# Patient Record
Sex: Male | Born: 1945
Health system: Southern US, Community
[De-identification: ages and names within clinical notes are randomized; demographics above are authoritative.]

## PROBLEM LIST (undated history)

## (undated) DIAGNOSIS — I10 Essential (primary) hypertension: Secondary | ICD-10-CM

## (undated) DIAGNOSIS — I724 Aneurysm of artery of lower extremity: Secondary | ICD-10-CM

## (undated) DIAGNOSIS — E7800 Pure hypercholesterolemia, unspecified: Secondary | ICD-10-CM

## (undated) DIAGNOSIS — D689 Coagulation defect, unspecified: Secondary | ICD-10-CM

## (undated) DIAGNOSIS — S62609A Fracture of unspecified phalanx of unspecified finger, initial encounter for closed fracture: Secondary | ICD-10-CM

## (undated) DIAGNOSIS — M1990 Unspecified osteoarthritis, unspecified site: Secondary | ICD-10-CM

## (undated) DIAGNOSIS — N200 Calculus of kidney: Secondary | ICD-10-CM

## (undated) HISTORY — PX: LITHOTRIPSY: SUR834

## (undated) HISTORY — DX: Coagulation defect, unspecified: D68.9

## (undated) HISTORY — PX: POLYPECTOMY: SHX149

---

## 2003-03-16 HISTORY — PX: ELECTROCARDIOGRAM: SHX264

## 2006-10-13 ENCOUNTER — Ambulatory Visit: Payer: Self-pay | Admitting: Family Medicine

## 2006-10-19 ENCOUNTER — Ambulatory Visit: Payer: Self-pay | Admitting: Family Medicine

## 2006-10-21 ENCOUNTER — Ambulatory Visit: Payer: Self-pay | Admitting: Gastroenterology

## 2006-11-04 ENCOUNTER — Ambulatory Visit: Payer: Self-pay | Admitting: Gastroenterology

## 2006-11-04 ENCOUNTER — Encounter (INDEPENDENT_AMBULATORY_CARE_PROVIDER_SITE_OTHER): Payer: Self-pay | Admitting: *Deleted

## 2006-11-04 HISTORY — PX: COLONOSCOPY: SHX174

## 2008-05-22 ENCOUNTER — Ambulatory Visit: Payer: Self-pay | Admitting: Family Medicine

## 2008-06-25 ENCOUNTER — Ambulatory Visit: Payer: Self-pay | Admitting: Family Medicine

## 2008-07-26 ENCOUNTER — Ambulatory Visit: Payer: Self-pay | Admitting: Family Medicine

## 2008-08-24 ENCOUNTER — Ambulatory Visit: Payer: Self-pay | Admitting: Family Medicine

## 2009-09-13 ENCOUNTER — Ambulatory Visit: Payer: Self-pay | Admitting: Family Medicine

## 2010-10-22 ENCOUNTER — Ambulatory Visit: Payer: Self-pay | Admitting: Family Medicine

## 2011-10-27 ENCOUNTER — Other Ambulatory Visit: Payer: Self-pay | Admitting: Family Medicine

## 2011-11-03 ENCOUNTER — Encounter: Payer: Self-pay | Admitting: Gastroenterology

## 2012-08-15 ENCOUNTER — Encounter: Payer: Self-pay | Admitting: Gastroenterology

## 2012-10-20 ENCOUNTER — Ambulatory Visit (INDEPENDENT_AMBULATORY_CARE_PROVIDER_SITE_OTHER): Payer: 59 | Admitting: Family Medicine

## 2012-10-20 ENCOUNTER — Encounter: Payer: Self-pay | Admitting: Family Medicine

## 2012-10-20 VITALS — BP 120/80 | HR 68 | Temp 98.2°F | Resp 16 | Ht 70.0 in | Wt 171.0 lb

## 2012-10-20 DIAGNOSIS — Z125 Encounter for screening for malignant neoplasm of prostate: Secondary | ICD-10-CM

## 2012-10-20 DIAGNOSIS — L309 Dermatitis, unspecified: Secondary | ICD-10-CM | POA: Insufficient documentation

## 2012-10-20 DIAGNOSIS — Z86010 Personal history of colonic polyps: Secondary | ICD-10-CM

## 2012-10-20 DIAGNOSIS — I10 Essential (primary) hypertension: Secondary | ICD-10-CM | POA: Insufficient documentation

## 2012-10-20 DIAGNOSIS — Z0000 Encounter for general adult medical examination without abnormal findings: Secondary | ICD-10-CM

## 2012-10-20 DIAGNOSIS — L259 Unspecified contact dermatitis, unspecified cause: Secondary | ICD-10-CM

## 2012-10-20 DIAGNOSIS — Z79899 Other long term (current) drug therapy: Secondary | ICD-10-CM

## 2012-10-20 LAB — LIPID PANEL
Cholesterol: 249 mg/dL — ABNORMAL HIGH (ref 0–200)
Total CHOL/HDL Ratio: 4.4 Ratio
Triglycerides: 112 mg/dL (ref <150)
VLDL: 22 mg/dL (ref 0–40)

## 2012-10-20 LAB — CBC WITH DIFFERENTIAL/PLATELET
Basophils Relative: 0 % (ref 0–1)
Eosinophils Absolute: 0.1 10*3/uL (ref 0.0–0.7)
Eosinophils Relative: 2 % (ref 0–5)
Hemoglobin: 14.2 g/dL (ref 13.0–17.0)
Lymphs Abs: 1.3 10*3/uL (ref 0.7–4.0)
MCH: 30.5 pg (ref 26.0–34.0)
MCHC: 35.1 g/dL (ref 30.0–36.0)
MCV: 86.9 fL (ref 78.0–100.0)
Monocytes Absolute: 0.3 10*3/uL (ref 0.1–1.0)
Monocytes Relative: 6 % (ref 3–12)
Neutrophils Relative %: 68 % (ref 43–77)
RBC: 4.66 MIL/uL (ref 4.22–5.81)

## 2012-10-20 LAB — POCT URINALYSIS DIPSTICK
Glucose, UA: NEGATIVE
Ketones, UA: NEGATIVE
Leukocytes, UA: NEGATIVE
Spec Grav, UA: <=1.005
Urobilinogen, UA: NEGATIVE

## 2012-10-20 LAB — PSA: PSA: 1.35 ng/mL (ref <=4.00)

## 2012-10-20 MED ORDER — AMLODIPINE BESYLATE 5 MG PO TABS: 5.0000 mg | ORAL_TABLET | Freq: Every day | ORAL | Status: DC

## 2012-10-20 MED ORDER — DESOXIMETASONE 0.25 % EX CREA: TOPICAL_CREAM | Freq: Two times a day (BID) | CUTANEOUS | Status: DC

## 2012-10-20 NOTE — Progress Notes (Signed)
Subjective:    Patient ID: Mike Wong, male    DOB: [birth year], 66 y.o.   MRN: [id number]  HPI He is here for complete examination. He would like to be switched to a different blood pressure medicine to help cut down on cost. Review his record indicates he had difficulty with lisinopril causing headaches and he would like to try something else. He also has a rash present on his right hand and in the past has used a steroid cream. He did see a dermatologist for this and would like a refill. He does have a previous history of colonoscopy showing tubular adenoma and he will need followup on this. Otherwise he has no concerns or questions. His family and social history were reviewed. He does plan on Medicare next year.   Review of Systems  Constitutional: Negative.   HENT: Negative.   Eyes: Negative.   Respiratory: Negative.   Cardiovascular: Negative.   Gastrointestinal: Negative.   Genitourinary: Negative.   Musculoskeletal: Negative.   Skin: Negative.   Neurological: Negative.   Hematological: Negative.   Psychiatric/Behavioral: Negative.        Objective:   Physical ExamBP 120/80  Pulse 68  Temp 98.2 F (36.8 C) (Oral)  Resp 16  Ht 5\' 10"  (1.778 m)  Wt 171 lb (77.565 kg)  BMI 24.54 kg/m2  General Appearance:    Alert, cooperative, no distress, appears stated age  Head:    Normocephalic, without obvious abnormality, atraumatic  Eyes:    PERRL, conjunctiva/corneas clear, EOM's intact, fundi    benign  Ears:    Normal TM's and external ear canals  Nose:   Nares normal, mucosa normal, no drainage or sinus   tenderness  Throat:   Lips, mucosa, and tongue normal; teeth and gums normal  Neck:   Supple, no lymphadenopathy;  thyroid:  no   enlargement/tenderness/nodules; no carotid   bruit or JVD  Back:    Spine nontender, no curvature, ROM normal, no CVA     tenderness  Lungs:     Clear to auscultation bilaterally without wheezes, rales or     ronchi; respirations  unlabored  Chest Wall:    No tenderness or deformity   Heart:    Regular rate and rhythm, S1 and S2 normal, no murmur, rub   or gallop  Breast Exam:    No chest wall tenderness, masses or gynecomastia  Abdomen:     Soft, non-tender, nondistended, normoactive bowel sounds,    no masses, no hepatosplenomegaly  Genitalia:   deferred   Rectal:   deferred   Extremities:   No clubbing, cyanosis or edema  Pulses:   2+ and symmetric all extremities  Skin:   Skin color, texture, turgor normal, no rashes or lesions  Lymph nodes:   Cervical, supraclavicular, and axillary nodes normal  Neurologic:   CNII-XII intact, normal strength, sensation and gait; reflexes 2+ and symmetric throughout          Psych:   Normal mood, affect, hygiene and grooming.   EKG is normal       Assessment & Plan:   1. Routine general medical examination at a health care facility  CBC with Differential, TSH, Lipid Panel, Urinalysis Dipstick, Visual acuity screening, Comprehensive metabolic panel, PR ELECTROCARDIOGRAM, COMPLETE, Lipid panel, CBC with Differential, Comprehensive metabolic panel, desoximetasone (TOPICORT) 0.25 % cream  2. Hypertension  amLODipine (NORVASC) 5 MG tablet  3. Dermatitis  desoximetasone (TOPICORT) 0.25 % cream  4. Encounter for long-term (current) use  of other medications    5. Special screening for malignant neoplasm of prostate  PSA  6. History of colonic polyps     flu shot was declined to

## 2012-10-21 ENCOUNTER — Encounter: Payer: Self-pay | Admitting: Internal Medicine

## 2012-10-24 LAB — COMPREHENSIVE METABOLIC PANEL
BUN: 20 mg/dL (ref 6–23)
CO2: 28 mEq/L (ref 19–32)
Calcium: 9.8 mg/dL (ref 8.4–10.5)
Chloride: 102 mEq/L (ref 96–112)
Creat: 0.88 mg/dL (ref 0.50–1.35)
Total Bilirubin: 0.6 mg/dL (ref 0.3–1.2)

## 2012-12-20 ENCOUNTER — Encounter: Payer: Self-pay | Admitting: Family Medicine

## 2012-12-20 ENCOUNTER — Ambulatory Visit (INDEPENDENT_AMBULATORY_CARE_PROVIDER_SITE_OTHER): Payer: BC Managed Care – PPO | Admitting: Family Medicine

## 2012-12-20 VITALS — BP 140/80 | HR 62 | Temp 98.4°F | Wt 172.0 lb

## 2012-12-20 DIAGNOSIS — I10 Essential (primary) hypertension: Secondary | ICD-10-CM

## 2012-12-20 MED ORDER — AMLODIPINE BESYLATE 5 MG PO TABS: 5.0000 mg | ORAL_TABLET | Freq: Every day | ORAL | Status: DC

## 2012-12-20 NOTE — Progress Notes (Signed)
  Subjective:    Patient ID: Mike Wong, male    DOB: 1946-02-03, 67 y.o.   MRN: [id number]  HPI  he is here for recheck. His insurance would no longer cover Benicar. He was switched amlodipine and is here for recheck. He is having no difficulty with that medication.   Review of Systems     Objective:   Physical Exam Alert and in no distress. Blood pressure is recorded.       Assessment & Plan:   1. Hypertension  amLODipine (NORVASC) 5 MG tablet   he is to continue on this medication.

## 2013-04-12 DIAGNOSIS — B351 Tinea unguium: Secondary | ICD-10-CM | POA: Diagnosis not present

## 2013-04-12 DIAGNOSIS — L408 Other psoriasis: Secondary | ICD-10-CM | POA: Diagnosis not present

## 2013-04-26 DIAGNOSIS — B352 Tinea manuum: Secondary | ICD-10-CM | POA: Diagnosis not present

## 2013-04-26 DIAGNOSIS — B351 Tinea unguium: Secondary | ICD-10-CM | POA: Diagnosis not present

## 2013-07-18 ENCOUNTER — Ambulatory Visit (INDEPENDENT_AMBULATORY_CARE_PROVIDER_SITE_OTHER): Payer: Medicare Other | Admitting: Family Medicine

## 2013-07-18 VITALS — BP 128/88 | HR 88 | Temp 99.9°F | Wt 169.0 lb

## 2013-07-18 DIAGNOSIS — J069 Acute upper respiratory infection, unspecified: Secondary | ICD-10-CM

## 2013-07-18 NOTE — Patient Instructions (Signed)
Any cough suppressant can be used but use NyQuil at nightUpper Respiratory Infection, Adult An upper respiratory infection (URI) is also known as the common cold. It is often caused by a type of germ (virus). Colds are easily spread (contagious). You can pass it to others by kissing, coughing, sneezing, or drinking out of the same glass. Usually, you get better in 1 or 2 weeks.  HOME CARE   Only take medicine as told by your doctor.  Use a warm mist humidifier or breathe in steam from a hot shower.  Drink enough water and fluids to keep your pee (urine) clear or pale yellow.  Get plenty of rest.  Return to work when your temperature is back to normal or as told by your doctor. You may use a face mask and wash your hands to stop your cold from spreading. GET HELP RIGHT AWAY IF:   After the first few days, you feel you are getting worse.  You have questions about your medicine.  You have chills, shortness of breath, or brown or red spit (mucus).  You have yellow or brown snot (nasal discharge) or pain in the face, especially when you bend forward.  You have a fever, puffy (swollen) neck, pain when you swallow, or white spots in the back of your throat.  You have a bad headache, ear pain, sinus pain, or chest pain.  You have a high-pitched whistling sound when you breathe in and out (wheezing).  You have a lasting cough or cough up blood.  You have sore muscles or a stiff neck. MAKE SURE YOU:   Understand these instructions.  Will watch your condition.  Will get help right away if you are not doing well or get worse. Document Released: 04/20/2008 Document Revised: 01/25/2012 Document Reviewed: 03/09/2011 [facility] Patient Information 2014 [place], [place].

## 2013-07-18 NOTE — Progress Notes (Signed)
  Subjective:    Patient ID: Mike Wong, male    DOB: 08/14/1946, 67 y.o.   MRN: [id number]  HPI 5 days ago he noted the onset of a slight sore throat slowly got worse. 2 days ago he started having difficulty with cough and a headache with rhinorrhea.   Review of Systems     Objective:   Physical Exam alert and in no distress. Tympanic membranes and canals are normal. Throat is clear. Tonsils are normal. Neck is supple without adenopathy or thyromegaly. Cardiac exam shows a regular sinus rhythm without murmurs or gallops. Lungs are clear to auscultation.        Assessment & Plan:  URI, acute  recommend supportive care including use of cough medications and NyQuil at night.

## 2014-01-21 ENCOUNTER — Other Ambulatory Visit: Payer: Self-pay | Admitting: Family Medicine

## 2014-01-22 NOTE — Telephone Encounter (Signed)
MEdication sent in

## 2014-01-22 NOTE — Telephone Encounter (Signed)
Forwarding to you :)

## 2014-03-13 ENCOUNTER — Encounter: Payer: Self-pay | Admitting: Family Medicine

## 2014-03-13 ENCOUNTER — Other Ambulatory Visit: Payer: Self-pay | Admitting: Family Medicine

## 2014-03-13 ENCOUNTER — Ambulatory Visit (INDEPENDENT_AMBULATORY_CARE_PROVIDER_SITE_OTHER): Payer: Medicare Other | Admitting: Family Medicine

## 2014-03-13 VITALS — BP 130/84 | HR 60 | Ht 70.0 in | Wt 172.0 lb

## 2014-03-13 DIAGNOSIS — Z86010 Personal history of colonic polyps: Secondary | ICD-10-CM

## 2014-03-13 DIAGNOSIS — N400 Enlarged prostate without lower urinary tract symptoms: Secondary | ICD-10-CM

## 2014-03-13 DIAGNOSIS — I10 Essential (primary) hypertension: Secondary | ICD-10-CM

## 2014-03-13 DIAGNOSIS — E785 Hyperlipidemia, unspecified: Secondary | ICD-10-CM | POA: Diagnosis not present

## 2014-03-13 DIAGNOSIS — Z8639 Personal history of other endocrine, nutritional and metabolic disease: Secondary | ICD-10-CM | POA: Diagnosis not present

## 2014-03-13 DIAGNOSIS — Z87442 Personal history of urinary calculi: Secondary | ICD-10-CM

## 2014-03-13 DIAGNOSIS — N401 Enlarged prostate with lower urinary tract symptoms: Secondary | ICD-10-CM | POA: Insufficient documentation

## 2014-03-13 LAB — COMPREHENSIVE METABOLIC PANEL
ALBUMIN: 4.4 g/dL (ref 3.5–5.2)
ALK PHOS: 93 U/L (ref 39–117)
ALT: 19 U/L (ref 0–53)
AST: 19 U/L (ref 0–37)
BUN: 13 mg/dL (ref 6–23)
CO2: 30 mEq/L (ref 19–32)
Calcium: 9.8 mg/dL (ref 8.4–10.5)
Chloride: 101 mEq/L (ref 96–112)
Creat: 0.91 mg/dL (ref 0.50–1.35)
Glucose, Bld: 88 mg/dL (ref 70–99)
POTASSIUM: 4 meq/L (ref 3.5–5.3)
SODIUM: 139 meq/L (ref 135–145)
TOTAL PROTEIN: 7.3 g/dL (ref 6.0–8.3)
Total Bilirubin: 0.6 mg/dL (ref 0.2–1.2)

## 2014-03-13 LAB — CBC WITH DIFFERENTIAL/PLATELET
BASOS PCT: 0 % (ref 0–1)
Basophils Absolute: 0 10*3/uL (ref 0.0–0.1)
EOS ABS: 0.1 10*3/uL (ref 0.0–0.7)
Eosinophils Relative: 2 % (ref 0–5)
HCT: 42.8 % (ref 39.0–52.0)
HEMOGLOBIN: 14.9 g/dL (ref 13.0–17.0)
Lymphocytes Relative: 23 % (ref 12–46)
Lymphs Abs: 1.4 10*3/uL (ref 0.7–4.0)
MCH: 31 pg (ref 26.0–34.0)
MCHC: 34.8 g/dL (ref 30.0–36.0)
MCV: 89 fL (ref 78.0–100.0)
MONOS PCT: 5 % (ref 3–12)
Monocytes Absolute: 0.3 10*3/uL (ref 0.1–1.0)
NEUTROS PCT: 70 % (ref 43–77)
Neutro Abs: 4.1 10*3/uL (ref 1.7–7.7)
PLATELETS: 286 10*3/uL (ref 150–400)
RBC: 4.81 MIL/uL (ref 4.22–5.81)
RDW: 13.5 % (ref 11.5–15.5)
WBC: 5.9 10*3/uL (ref 4.0–10.5)

## 2014-03-13 LAB — POCT URINALYSIS DIPSTICK
Bilirubin, UA: NEGATIVE
Blood, UA: NEGATIVE
GLUCOSE UA: NEGATIVE
Ketones, UA: NEGATIVE
LEUKOCYTES UA: NEGATIVE
NITRITE UA: NEGATIVE
Protein, UA: NEGATIVE
Spec Grav, UA: 1.01
UROBILINOGEN UA: NEGATIVE
pH, UA: 7

## 2014-03-13 LAB — LIPID PANEL
CHOLESTEROL: 277 mg/dL — AB (ref 0–200)
HDL: 63 mg/dL (ref >39)
LDL CALC: 172 mg/dL — AB (ref 0–99)
Total CHOL/HDL Ratio: 4.4 Ratio
Triglycerides: 210 mg/dL — ABNORMAL HIGH (ref <150)
VLDL: 42 mg/dL — AB (ref 0–40)

## 2014-03-13 MED ORDER — AMLODIPINE BESYLATE 5 MG PO TABS: ORAL_TABLET | ORAL | Status: DC

## 2014-03-13 NOTE — Progress Notes (Signed)
   Subjective:    Patient ID: Mike Wong, male    DOB: [birth year], 68 y.o.   MRN: [id number]  HPI He is here for an interval evaluation. He continues on his blood pressure medications and is having no difficulty. He does have a previous history of colonic polyps. Review his record indicates he needs a referral for followup on this. He also has a previous history of hyperlipidemia as well as vitamin D deficiency and BPH. He has had no BPH symptoms. His remote history of renal stone. His had no difficulty with stone in the last several years. He does not smoke or drink. His work is going well. His home life is unchanged. Rest the family and social history was reviewed.   Review of Systems  All other systems reviewed and are negative.      Objective:   Physical Exam BP 130/84  Pulse 60  Ht 5\' 10"  (1.778 m)  Wt 172 lb (78.019 kg)  BMI 24.68 kg/m2  General Appearance:    Alert, cooperative, no distress, appears stated age  Head:    Normocephalic, without obvious abnormality, atraumatic  Eyes:    PERRL, conjunctiva/corneas clear, EOM's intact, fundi    benign  Ears:    Normal TM's and external ear canals  Nose:   Nares normal, mucosa normal, no drainage or sinus   tenderness  Throat:   Lips, mucosa, and tongue normal; teeth and gums normal  Neck:   Supple, no lymphadenopathy;  thyroid:  no   enlargement/tenderness/nodules; no carotid   bruit or JVD  Back:    Spine nontender, no curvature, ROM normal, no CVA     tenderness  Lungs:     Clear to auscultation bilaterally without wheezes, rales or     ronchi; respirations unlabored  Chest Wall:    No tenderness or deformity   Heart:    Regular rate and rhythm, S1 and S2 normal, no murmur, rub   or gallop  Breast Exam:    No chest wall tenderness, masses or gynecomastia  Abdomen:     Soft, non-tender, nondistended, normoactive bowel sounds,    no masses, no hepatosplenomegaly  Genitalia:   deferred   Rectal:   deferred    Extremities:   No clubbing, cyanosis or edema  Pulses:   2+ and symmetric all extremities  Skin:   Skin color, texture, turgor normal, no rashes or lesions  Lymph nodes:   Cervical, supraclavicular, and axillary nodes normal  Neurologic:   CNII-XII intact, normal strength, sensation and gait; reflexes 2+ and symmetric throughout          Psych:   Normal mood, affect, hygiene and grooming.          Assessment & Plan:  Hypertension - Plan: POCT urinalysis dipstick, Comprehensive metabolic panel, CBC with Differential, amLODipine (NORVASC) 5 MG tablet  Personal history of colonic polyps - Plan: Ambulatory referral to Gastroenterology  Hyperlipidemia LDL goal < 130 - Plan: Lipid panel  History of vitamin D deficiency  BPH (benign prostatic hyperplasia)  History of renal stone  advanced directive was discussed with him. He does have one in place.

## 2014-03-14 LAB — VITAMIN D 25 HYDROXY (VIT D DEFICIENCY, FRACTURES): VIT D 25 HYDROXY: 36 ng/mL (ref 30–89)

## 2014-03-15 MED ORDER — ATORVASTATIN CALCIUM 10 MG PO TABS: 10.0000 mg | ORAL_TABLET | Freq: Every day | ORAL | Status: DC

## 2014-03-15 NOTE — Addendum Note (Signed)
Addended by: Pernell Dupre A on: 03/15/2014 12:07 PM   Modules accepted: Orders

## 2014-04-16 ENCOUNTER — Encounter: Payer: Self-pay | Admitting: Family Medicine

## 2014-05-14 ENCOUNTER — Ambulatory Visit (INDEPENDENT_AMBULATORY_CARE_PROVIDER_SITE_OTHER): Payer: Medicare Other | Admitting: Family Medicine

## 2014-05-14 ENCOUNTER — Other Ambulatory Visit: Payer: Self-pay

## 2014-05-14 ENCOUNTER — Encounter: Payer: Self-pay | Admitting: Family Medicine

## 2014-05-14 VITALS — BP 150/100 | HR 100 | Wt 175.0 lb

## 2014-05-14 DIAGNOSIS — Z79899 Other long term (current) drug therapy: Secondary | ICD-10-CM

## 2014-05-14 DIAGNOSIS — E785 Hyperlipidemia, unspecified: Secondary | ICD-10-CM | POA: Diagnosis not present

## 2014-05-14 DIAGNOSIS — I10 Essential (primary) hypertension: Secondary | ICD-10-CM

## 2014-05-14 MED ORDER — ATORVASTATIN CALCIUM 10 MG PO TABS: 10.0000 mg | ORAL_TABLET | Freq: Every day | ORAL | Status: DC

## 2014-05-14 NOTE — Patient Instructions (Signed)
Keep track of your blood pressure and if it starts to go above 140/90, let me know

## 2014-05-14 NOTE — Telephone Encounter (Signed)
SENT IN PT LIPITOR PER HIS REQUEST

## 2014-05-14 NOTE — Progress Notes (Signed)
   Subjective:    Patient ID: Mike Wong, male    DOB: [birth year], 68 y.o.   MRN: [id number]  HPI He is here for recheck. He continues on medications listed in the chart and is having no difficulty with them. He does state that he checks his blood pressure at home and the numbers always look quite good. He has no concerns or complaints.   Review of Systems     Objective:   Physical Exam  Alert and in no distress otherwise not examined      Assessment & Plan:  Hyperlipidemia with target LDL less than 130 - Plan: Lipid panel  Encounter for long-term (current) use of other medications - Plan: Lipid panel  Essential hypertension  he will keep track of his blood pressure and it does start to go above 140/90, he is to let me know.

## 2014-05-15 ENCOUNTER — Telehealth: Payer: Self-pay

## 2014-05-15 LAB — LIPID PANEL
CHOLESTEROL: 161 mg/dL (ref 0–200)
HDL: 50 mg/dL (ref >39)
LDL Cholesterol: 70 mg/dL (ref 0–99)
TRIGLYCERIDES: 206 mg/dL — AB (ref <150)
Total CHOL/HDL Ratio: 3.2 Ratio
VLDL: 41 mg/dL — AB (ref 0–40)

## 2014-05-15 MED ORDER — ATORVASTATIN CALCIUM 10 MG PO TABS: 10.0000 mg | ORAL_TABLET | Freq: Every day | ORAL | Status: DC

## 2014-05-15 NOTE — Telephone Encounter (Signed)
Called pt he was informed of his labs looking good and med called in pt verbalized understanding

## 2014-05-15 NOTE — Addendum Note (Signed)
Addended by: [name] on: 05/15/2014 08:06 AM   Modules accepted: Orders

## 2015-03-05 ENCOUNTER — Other Ambulatory Visit: Payer: Self-pay | Admitting: Family Medicine

## 2015-03-05 DIAGNOSIS — H2513 Age-related nuclear cataract, bilateral: Secondary | ICD-10-CM | POA: Diagnosis not present

## 2015-03-05 DIAGNOSIS — H25013 Cortical age-related cataract, bilateral: Secondary | ICD-10-CM | POA: Diagnosis not present

## 2015-03-19 ENCOUNTER — Ambulatory Visit (INDEPENDENT_AMBULATORY_CARE_PROVIDER_SITE_OTHER): Payer: Medicare Other | Admitting: Family Medicine

## 2015-03-19 ENCOUNTER — Encounter: Payer: Self-pay | Admitting: Family Medicine

## 2015-03-19 VITALS — BP 148/98 | HR 68 | Ht 70.0 in | Wt 169.2 lb

## 2015-03-19 DIAGNOSIS — Z136 Encounter for screening for cardiovascular disorders: Secondary | ICD-10-CM

## 2015-03-19 DIAGNOSIS — Z87442 Personal history of urinary calculi: Secondary | ICD-10-CM

## 2015-03-19 DIAGNOSIS — I10 Essential (primary) hypertension: Secondary | ICD-10-CM

## 2015-03-19 DIAGNOSIS — Z8639 Personal history of other endocrine, nutritional and metabolic disease: Secondary | ICD-10-CM

## 2015-03-19 DIAGNOSIS — E785 Hyperlipidemia, unspecified: Secondary | ICD-10-CM

## 2015-03-19 DIAGNOSIS — N400 Enlarged prostate without lower urinary tract symptoms: Secondary | ICD-10-CM | POA: Diagnosis not present

## 2015-03-19 DIAGNOSIS — Z86010 Personal history of colonic polyps: Secondary | ICD-10-CM | POA: Diagnosis not present

## 2015-03-19 DIAGNOSIS — Z0000 Encounter for general adult medical examination without abnormal findings: Secondary | ICD-10-CM

## 2015-03-19 DIAGNOSIS — Z23 Encounter for immunization: Secondary | ICD-10-CM | POA: Diagnosis not present

## 2015-03-19 MED ORDER — AMLODIPINE BESYLATE 10 MG PO TABS: 10.0000 mg | ORAL_TABLET | Freq: Every day | ORAL | Status: DC

## 2015-03-19 NOTE — Progress Notes (Signed)
Subjective:    Patient ID: Mike Wong, male    DOB: Jan 28, 1946, 69 y.o.   MRN: [id number]  HPI He is here for an examination. He continues on amlodipine and Lipitor and is having no difficulties with them. He will have another colonoscopy in approximately one year. Does have a previous history of vitamin D deficiency and does intermittently take a multivitamin. He continues to work and has no intentions to quit. Family and social history as well as immunizations were reviewed. He does have a remote history of smoking. His marriage is going well. He has had no chest pain, shortness breath, GI issues. He had a renal stone approximately 10 years ago. He does have a history of BPH but no symptoms interfering with his ADLs. He does have an advanced directive.  Review of Systems  All other systems reviewed and are negative.      Objective:   Physical Exam BP 148/98 mmHg  Pulse 68  Ht 5\' 10"  (1.778 m)  Wt 169 lb 3.2 oz (76.749 kg)  BMI 24.28 kg/m2  General Appearance:    Alert, cooperative, no distress, appears stated age  Head:    Normocephalic, without obvious abnormality, atraumatic  Eyes:    PERRL, conjunctiva/corneas clear, EOM's intact, fundi    benign  Ears:    Normal TM's and external ear canals  Nose:   Nares normal, mucosa normal, no drainage or sinus   tenderness  Throat:   Lips, mucosa, and tongue normal; teeth and gums normal  Neck:   Supple, no lymphadenopathy;  thyroid:  no   enlargement/tenderness/nodules; no carotid   bruit or JVD  Back:    Spine nontender, no curvature, ROM normal, no CVA     tenderness  Lungs:     Clear to auscultation bilaterally without wheezes, rales or     ronchi; respirations unlabored  Chest Wall:    No tenderness or deformity   Heart:    Regular rate and rhythm, S1 and S2 normal, no murmur, rub   or gallop  Breast Exam:    No chest wall tenderness, masses or gynecomastia  Abdomen:     Soft, non-tender, nondistended, normoactive bowel  sounds,    no masses, no hepatosplenomegaly  Genitalia:    Normal male external genitalia without lesions.  Testicles without masses.  No inguinal hernias.  Rectal:    Normal sphincter tone, no masses or tenderness; guaiac negative stool.  Prostate smooth, no nodules, not enlarged.  Extremities:   No clubbing, cyanosis or edema  Pulses:   2+ and symmetric all extremities  Skin:   Skin color, texture, turgor normal, no rashes or lesions  Lymph nodes:   Cervical, supraclavicular, and axillary nodes normal  Neurologic:   CNII-XII intact, normal strength, sensation and gait; reflexes 2+ and symmetric throughout          Psych:   Normal mood, affect, hygiene and grooming.          Assessment & Plan:  Routine general medical examination at a health care facility  History of colonic polyps  Essential hypertension - Plan: CBC with Differential/Platelet, Comprehensive metabolic panel, amLODipine (NORVASC) 10 MG tablet  Hyperlipidemia with target LDL less than 130 - Plan: Lipid panel  History of renal stone  History of vitamin D deficiency  BPH (benign prostatic hyperplasia)  Screening for AAA (abdominal aortic aneurysm) - Plan: [place] Screening AAA, CANCELED: [place] Aorta Initial Medicare Screen  Need for prophylactic vaccination against Streptococcus pneumoniae (pneumococcus) -  Plan: Pneumococcal conjugate vaccine 13-valent I will increase his Norvasc and have him return here in one month. I encouraged him to continue to take good care of himself.

## 2015-03-20 LAB — LIPID PANEL
CHOLESTEROL: 194 mg/dL (ref 0–200)
HDL: 62 mg/dL (ref >=40)
LDL Cholesterol: 106 mg/dL — ABNORMAL HIGH (ref 0–99)
Total CHOL/HDL Ratio: 3.1 Ratio
Triglycerides: 130 mg/dL (ref <150)
VLDL: 26 mg/dL (ref 0–40)

## 2015-03-20 LAB — CBC WITH DIFFERENTIAL/PLATELET
Basophils Absolute: 0 10*3/uL (ref 0.0–0.1)
Basophils Relative: 0 % (ref 0–1)
EOS ABS: 0.1 10*3/uL (ref 0.0–0.7)
Eosinophils Relative: 2 % (ref 0–5)
HCT: 42.4 % (ref 39.0–52.0)
HEMOGLOBIN: 14.3 g/dL (ref 13.0–17.0)
LYMPHS ABS: 1.2 10*3/uL (ref 0.7–4.0)
Lymphocytes Relative: 19 % (ref 12–46)
MCH: 30.8 pg (ref 26.0–34.0)
MCHC: 33.7 g/dL (ref 30.0–36.0)
MCV: 91.4 fL (ref 78.0–100.0)
MPV: 10.3 fL (ref 8.6–12.4)
Monocytes Absolute: 0.4 10*3/uL (ref 0.1–1.0)
Monocytes Relative: 6 % (ref 3–12)
Neutro Abs: 4.5 10*3/uL (ref 1.7–7.7)
Neutrophils Relative %: 73 % (ref 43–77)
Platelets: 276 10*3/uL (ref 150–400)
RBC: 4.64 MIL/uL (ref 4.22–5.81)
RDW: 13.5 % (ref 11.5–15.5)
WBC: 6.1 10*3/uL (ref 4.0–10.5)

## 2015-03-20 LAB — COMPREHENSIVE METABOLIC PANEL
ALT: 30 U/L (ref 0–53)
AST: 28 U/L (ref 0–37)
Albumin: 4.5 g/dL (ref 3.5–5.2)
Alkaline Phosphatase: 87 U/L (ref 39–117)
BUN: 16 mg/dL (ref 6–23)
CALCIUM: 9.4 mg/dL (ref 8.4–10.5)
CHLORIDE: 105 meq/L (ref 96–112)
CO2: 25 meq/L (ref 19–32)
CREATININE: 0.96 mg/dL (ref 0.50–1.35)
Glucose, Bld: 99 mg/dL (ref 70–99)
Potassium: 4.1 mEq/L (ref 3.5–5.3)
Sodium: 141 mEq/L (ref 135–145)
TOTAL PROTEIN: 7.2 g/dL (ref 6.0–8.3)
Total Bilirubin: 0.7 mg/dL (ref 0.2–1.2)

## 2015-03-22 ENCOUNTER — Ambulatory Visit: Payer: Self-pay

## 2015-03-26 NOTE — Addendum Note (Signed)
Addended by: [name] on: 03/26/2015 01:09 PM   Modules accepted: Level of Service

## 2015-04-09 ENCOUNTER — Other Ambulatory Visit: Payer: Self-pay | Admitting: Family Medicine

## 2015-04-09 ENCOUNTER — Ambulatory Visit
Admission: RE | Admit: 2015-04-09 | Discharge: 2015-04-09 | Disposition: A | Discharge status: Home / Self Care | Payer: Medicare Other | Source: Ambulatory Visit | Attending: Family Medicine | Admitting: Family Medicine

## 2015-04-09 DIAGNOSIS — Z136 Encounter for screening for cardiovascular disorders: Secondary | ICD-10-CM

## 2015-04-11 DIAGNOSIS — H6523 Chronic serous otitis media, bilateral: Secondary | ICD-10-CM | POA: Diagnosis not present

## 2015-04-11 DIAGNOSIS — J370 Chronic laryngitis: Secondary | ICD-10-CM | POA: Diagnosis not present

## 2015-04-11 DIAGNOSIS — J320 Chronic maxillary sinusitis: Secondary | ICD-10-CM | POA: Diagnosis not present

## 2015-04-11 DIAGNOSIS — J322 Chronic ethmoidal sinusitis: Secondary | ICD-10-CM | POA: Diagnosis not present

## 2015-05-29 ENCOUNTER — Other Ambulatory Visit: Payer: Self-pay | Admitting: Family Medicine

## 2015-08-18 ENCOUNTER — Other Ambulatory Visit: Payer: Self-pay | Admitting: Family Medicine

## 2015-10-07 ENCOUNTER — Encounter (HOSPITAL_BASED_OUTPATIENT_CLINIC_OR_DEPARTMENT_OTHER): Payer: Self-pay | Admitting: *Deleted

## 2015-10-07 ENCOUNTER — Other Ambulatory Visit: Payer: Self-pay | Admitting: Orthopedic Surgery

## 2015-10-07 DIAGNOSIS — S62324A Displaced fracture of shaft of fourth metacarpal bone, right hand, initial encounter for closed fracture: Secondary | ICD-10-CM | POA: Insufficient documentation

## 2015-10-08 ENCOUNTER — Encounter (HOSPITAL_BASED_OUTPATIENT_CLINIC_OR_DEPARTMENT_OTHER)
Admission: RE | Disposition: A | Discharge status: Home / Self Care | Payer: Self-pay | Source: Ambulatory Visit | Attending: Orthopedic Surgery

## 2015-10-08 ENCOUNTER — Ambulatory Visit (HOSPITAL_BASED_OUTPATIENT_CLINIC_OR_DEPARTMENT_OTHER)
Admission: RE | Admit: 2015-10-08 | Discharge: 2015-10-08 | Disposition: A | Discharge status: Home / Self Care | Payer: Medicare Other | Source: Ambulatory Visit | Attending: Orthopedic Surgery | Admitting: Orthopedic Surgery

## 2015-10-08 ENCOUNTER — Ambulatory Visit (HOSPITAL_BASED_OUTPATIENT_CLINIC_OR_DEPARTMENT_OTHER): Payer: Medicare Other | Admitting: Anesthesiology

## 2015-10-08 ENCOUNTER — Encounter (HOSPITAL_BASED_OUTPATIENT_CLINIC_OR_DEPARTMENT_OTHER): Payer: Self-pay | Admitting: Orthopedic Surgery

## 2015-10-08 DIAGNOSIS — W502XXA Accidental twist by another person, initial encounter: Secondary | ICD-10-CM | POA: Insufficient documentation

## 2015-10-08 DIAGNOSIS — E7800 Pure hypercholesterolemia, unspecified: Secondary | ICD-10-CM | POA: Diagnosis not present

## 2015-10-08 DIAGNOSIS — Z87891 Personal history of nicotine dependence: Secondary | ICD-10-CM | POA: Diagnosis not present

## 2015-10-08 DIAGNOSIS — Y990 Civilian activity done for income or pay: Secondary | ICD-10-CM | POA: Insufficient documentation

## 2015-10-08 DIAGNOSIS — Y9289 Other specified places as the place of occurrence of the external cause: Secondary | ICD-10-CM | POA: Insufficient documentation

## 2015-10-08 DIAGNOSIS — Y9389 Activity, other specified: Secondary | ICD-10-CM | POA: Diagnosis not present

## 2015-10-08 DIAGNOSIS — S62394A Other fracture of fourth metacarpal bone, right hand, initial encounter for closed fracture: Secondary | ICD-10-CM | POA: Diagnosis not present

## 2015-10-08 DIAGNOSIS — I10 Essential (primary) hypertension: Secondary | ICD-10-CM | POA: Insufficient documentation

## 2015-10-08 DIAGNOSIS — S62304A Unspecified fracture of fourth metacarpal bone, right hand, initial encounter for closed fracture: Secondary | ICD-10-CM | POA: Diagnosis not present

## 2015-10-08 HISTORY — DX: Essential (primary) hypertension: I10

## 2015-10-08 HISTORY — DX: Fracture of unspecified phalanx of unspecified finger, initial encounter for closed fracture: S62.609A

## 2015-10-08 HISTORY — DX: Pure hypercholesterolemia, unspecified: E78.00

## 2015-10-08 HISTORY — PX: OPEN REDUCTION INTERNAL FIXATION (ORIF) METACARPAL: SHX6234

## 2015-10-08 SURGERY — OPEN REDUCTION INTERNAL FIXATION (ORIF) METACARPAL
Anesthesia: General | Site: Hand | Laterality: Right

## 2015-10-08 MED ORDER — CHLORHEXIDINE GLUCONATE 4 % EX LIQD
60.0000 mL | Freq: Once | CUTANEOUS | Status: DC
Start: 2015-10-08 — End: 2015-10-08

## 2015-10-08 MED ORDER — ONDANSETRON HCL 4 MG/2ML IJ SOLN
INTRAMUSCULAR | Status: AC
  Filled 2015-10-08: qty 2

## 2015-10-08 MED ORDER — FENTANYL CITRATE (PF) 100 MCG/2ML IJ SOLN
INTRAMUSCULAR | Status: AC
  Filled 2015-10-08: qty 2

## 2015-10-08 MED ORDER — LIDOCAINE HCL (CARDIAC) 20 MG/ML IV SOLN
INTRAVENOUS | Status: AC
  Filled 2015-10-08: qty 5

## 2015-10-08 MED ORDER — BUPIVACAINE HCL (PF) 0.25 % IJ SOLN
INTRAMUSCULAR | Status: DC | PRN
  Administered 2015-10-08: 5.5 mL

## 2015-10-08 MED ORDER — KETOROLAC TROMETHAMINE 30 MG/ML IJ SOLN
30.0000 mg | Freq: Once | INTRAMUSCULAR | Status: AC
  Administered 2015-10-08: 30 mg via INTRAVENOUS

## 2015-10-08 MED ORDER — CEFAZOLIN SODIUM-DEXTROSE 2-3 GM-% IV SOLR: 2.0000 g | INTRAVENOUS | Status: DC

## 2015-10-08 MED ORDER — DEXAMETHASONE SODIUM PHOSPHATE 10 MG/ML IJ SOLN
INTRAMUSCULAR | Status: DC | PRN
  Administered 2015-10-08: 10 mg via INTRAVENOUS

## 2015-10-08 MED ORDER — LACTATED RINGERS IV SOLN
INTRAVENOUS | Status: DC
  Administered 2015-10-08: 10:00:00 via INTRAVENOUS

## 2015-10-08 MED ORDER — CEFAZOLIN SODIUM-DEXTROSE 2-3 GM-% IV SOLR
2.0000 g | INTRAVENOUS | Status: DC
Start: 2015-10-08 — End: 2015-10-08
  Administered 2015-10-08: 2 g via INTRAVENOUS

## 2015-10-08 MED ORDER — FENTANYL CITRATE (PF) 100 MCG/2ML IJ SOLN
50.0000 ug | INTRAMUSCULAR | Status: AC | PRN
  Administered 2015-10-08: 25 ug via INTRAVENOUS
  Administered 2015-10-08: 50 ug via INTRAVENOUS
  Administered 2015-10-08: 25 ug via INTRAVENOUS

## 2015-10-08 MED ORDER — ONDANSETRON HCL 4 MG/2ML IJ SOLN
INTRAMUSCULAR | Status: DC | PRN
  Administered 2015-10-08: 4 mg via INTRAVENOUS

## 2015-10-08 MED ORDER — FENTANYL CITRATE (PF) 100 MCG/2ML IJ SOLN: 25.0000 ug | INTRAMUSCULAR | Status: DC | PRN

## 2015-10-08 MED ORDER — LIDOCAINE HCL (CARDIAC) 20 MG/ML IV SOLN
INTRAVENOUS | Status: DC | PRN
  Administered 2015-10-08: 60 mg via INTRAVENOUS

## 2015-10-08 MED ORDER — PROPOFOL 10 MG/ML IV BOLUS
INTRAVENOUS | Status: DC | PRN
  Administered 2015-10-08: 200 mg via INTRAVENOUS

## 2015-10-08 MED ORDER — PROMETHAZINE HCL 25 MG/ML IJ SOLN: 6.2500 mg | INTRAMUSCULAR | Status: DC | PRN

## 2015-10-08 MED ORDER — GLYCOPYRROLATE 0.2 MG/ML IJ SOLN: 0.2000 mg | Freq: Once | INTRAMUSCULAR | Status: DC | PRN

## 2015-10-08 MED ORDER — CEFAZOLIN SODIUM-DEXTROSE 2-3 GM-% IV SOLR
INTRAVENOUS | Status: AC
  Filled 2015-10-08: qty 50

## 2015-10-08 MED ORDER — DEXAMETHASONE SODIUM PHOSPHATE 10 MG/ML IJ SOLN
INTRAMUSCULAR | Status: AC
  Filled 2015-10-08: qty 1

## 2015-10-08 MED ORDER — KETOROLAC TROMETHAMINE 30 MG/ML IJ SOLN
INTRAMUSCULAR | Status: AC
  Filled 2015-10-08: qty 1

## 2015-10-08 MED ORDER — MIDAZOLAM HCL 2 MG/2ML IJ SOLN: 1.0000 mg | INTRAMUSCULAR | Status: DC | PRN

## 2015-10-08 MED ORDER — SCOPOLAMINE 1 MG/3DAYS TD PT72: 1.0000 | MEDICATED_PATCH | Freq: Once | TRANSDERMAL | Status: DC | PRN

## 2015-10-08 MED ORDER — HYDROCODONE-ACETAMINOPHEN 5-325 MG PO TABS: 1.0000 | ORAL_TABLET | Freq: Four times a day (QID) | ORAL | Status: DC | PRN

## 2015-10-08 SURGICAL SUPPLY — 54 items
BIT DRILL 1.0 (BIT) ×2
BIT DRILL 1.0MM (BIT) ×1
BIT DRILL 1.0X50 (BIT) IMPLANT
BLADE MINI RND TIP GREEN BEAV (BLADE) ×2 IMPLANT
BLADE SURG 15 STRL LF DISP TIS (BLADE) ×1 IMPLANT
BLADE SURG 15 STRL SS (BLADE) ×3
BNDG CMPR 9X4 STRL LF SNTH (GAUZE/BANDAGES/DRESSINGS) ×1
BNDG COHESIVE 3X5 TAN STRL LF (GAUZE/BANDAGES/DRESSINGS) ×5 IMPLANT
BNDG ESMARK 4X9 LF (GAUZE/BANDAGES/DRESSINGS) ×3 IMPLANT
BNDG GAUZE ELAST 4 BULKY (GAUZE/BANDAGES/DRESSINGS) ×2 IMPLANT
CHLORAPREP W/TINT 26ML (MISCELLANEOUS) ×3 IMPLANT
CORDS BIPOLAR (ELECTRODE) ×3 IMPLANT
COVER BACK TABLE 60X90IN (DRAPES) ×3 IMPLANT
COVER MAYO STAND STRL (DRAPES) ×3 IMPLANT
CUFF TOURNIQUET SINGLE 18IN (TOURNIQUET CUFF) ×2 IMPLANT
DECANTER SPIKE VIAL GLASS SM (MISCELLANEOUS) IMPLANT
DRAPE EXTREMITY T 121X128X90 (DRAPE) ×3 IMPLANT
DRAPE OEC MINIVIEW 54X84 (DRAPES) ×3 IMPLANT
DRAPE SURG 17X23 STRL (DRAPES) ×3 IMPLANT
GAUZE SPONGE 4X4 12PLY STRL (GAUZE/BANDAGES/DRESSINGS) ×3 IMPLANT
GAUZE XEROFORM 1X8 LF (GAUZE/BANDAGES/DRESSINGS) ×3 IMPLANT
GLOVE BIO SURGEON STRL SZ 6.5 (GLOVE) ×1 IMPLANT
GLOVE BIO SURGEONS STRL SZ 6.5 (GLOVE) ×1
GLOVE BIOGEL PI IND STRL 7.0 (GLOVE) IMPLANT
GLOVE BIOGEL PI IND STRL 8.5 (GLOVE) ×1 IMPLANT
GLOVE BIOGEL PI INDICATOR 7.0 (GLOVE) ×2
GLOVE BIOGEL PI INDICATOR 8.5 (GLOVE) ×2
GLOVE EXAM NITRILE LRG STRL (GLOVE) ×2 IMPLANT
GLOVE SURG ORTHO 8.0 STRL STRW (GLOVE) ×3 IMPLANT
GOWN STRL REUS W/ TWL LRG LVL3 (GOWN DISPOSABLE) IMPLANT
GOWN STRL REUS W/TWL LRG LVL3 (GOWN DISPOSABLE) ×3
GOWN STRL REUS W/TWL XL LVL3 (GOWN DISPOSABLE) ×3 IMPLANT
NDL PRECISIONGLIDE 27X1.5 (NEEDLE) IMPLANT
NEEDLE PRECISIONGLIDE 27X1.5 (NEEDLE) ×3 IMPLANT
NS IRRIG 1000ML POUR BTL (IV SOLUTION) ×3 IMPLANT
PACK BASIN DAY SURGERY FS (CUSTOM PROCEDURE TRAY) ×3 IMPLANT
PAD CAST 3X4 CTTN HI CHSV (CAST SUPPLIES) ×1 IMPLANT
PADDING CAST ABS 4INX4YD NS (CAST SUPPLIES) ×2
PADDING CAST ABS COTTON 4X4 ST (CAST SUPPLIES) ×1 IMPLANT
PADDING CAST COTTON 3X4 STRL (CAST SUPPLIES) ×3
SCREW SELF TAP CORTEX 1.3 10MM (Screw) ×2 IMPLANT
SCREW SELF TAP CORTEX 1.3 8MM (Screw) ×6 IMPLANT
SLEEVE SCD COMPRESS KNEE MED (MISCELLANEOUS) ×2 IMPLANT
SPLINT PLASTER CAST XFAST 3X15 (CAST SUPPLIES) IMPLANT
SPLINT PLASTER XTRA FASTSET 3X (CAST SUPPLIES)
STOCKINETTE 4X48 STRL (DRAPES) ×3 IMPLANT
SUT CHROMIC 5 0 P 3 (SUTURE) IMPLANT
SUT ETHILON 4 0 PS 2 18 (SUTURE) ×3 IMPLANT
SUT MERSILENE 4 0 P 3 (SUTURE) IMPLANT
SUT VICRYL 4-0 PS2 18IN ABS (SUTURE) ×2 IMPLANT
SYR BULB 3OZ (MISCELLANEOUS) ×3 IMPLANT
SYR CONTROL 10ML LL (SYRINGE) ×2 IMPLANT
TOWEL OR 17X24 6PK STRL BLUE (TOWEL DISPOSABLE) ×3 IMPLANT
UNDERPAD 30X30 (UNDERPADS AND DIAPERS) ×3 IMPLANT

## 2015-10-08 NOTE — Discharge Instructions (Addendum)

## 2015-10-08 NOTE — Anesthesia Preprocedure Evaluation (Signed)
Anesthesia Evaluation  Patient identified by MRN, date of birth, ID band Patient awake    Reviewed: Allergy & Precautions, NPO status , Patient's Chart, lab work & pertinent test results  History of Anesthesia Complications (+) PONV  Airway Mallampati: II  TM Distance: >3 FB Neck ROM: Full    Dental no notable dental hx.    Pulmonary neg pulmonary ROS, former smoker,    Pulmonary exam normal breath sounds clear to auscultation       Cardiovascular hypertension, Pt. on medications Normal cardiovascular exam Rhythm:Regular Rate:Normal     Neuro/Psych negative neurological ROS  negative psych ROS   GI/Hepatic negative GI ROS, Neg liver ROS,   Endo/Other  negative endocrine ROS  Renal/GU negative Renal ROS  negative genitourinary   Musculoskeletal negative musculoskeletal ROS (+)   Abdominal   Peds negative pediatric ROS (+)  Hematology negative hematology ROS (+)   Anesthesia Other Findings   Reproductive/Obstetrics negative OB ROS                             Anesthesia Physical Anesthesia Plan  ASA: II  Anesthesia Plan: General   Post-op Pain Management:    Induction: Intravenous  Airway Management Planned: LMA  Additional Equipment:   Intra-op Plan:   Post-operative Plan: Extubation in OR  Informed Consent: I have reviewed the patients History and Physical, chart, labs and discussed the procedure including the risks, benefits and alternatives for the proposed anesthesia with the patient or authorized representative who has indicated his/her understanding and acceptance.   Dental advisory given  Plan Discussed with: CRNA and Surgeon  Anesthesia Plan Comments:         Anesthesia Quick Evaluation

## 2015-10-08 NOTE — Anesthesia Postprocedure Evaluation (Signed)
Anesthesia Post Note  Patient: Mike Wong  Procedure(s) Performed: Procedure(s) (LRB): OPEN REDUCTION INTERNAL FIXATION (ORIF) METACARPAL RIGHT RING FINGER (Right)  Patient location during evaluation: PACU Anesthesia Type: General Level of consciousness: awake and alert Pain management: pain level controlled Vital Signs Assessment: post-procedure vital signs reviewed and stable Respiratory status: spontaneous breathing, nonlabored ventilation, respiratory function stable and patient connected to nasal cannula oxygen Cardiovascular status: blood pressure returned to baseline and stable Postop Assessment: No signs of nausea or vomiting Anesthetic complications: no    Last Vitals:  Filed Vitals:   10/08/15 1140 10/08/15 1145  BP:  131/82  Pulse: 82 83  Temp: 36.5 C   Resp:  13    Last Pain: There were no vitals filed for this visit.               Dave Mannes S

## 2015-10-08 NOTE — Op Note (Signed)
Mike Wong, Mike Wong          ACCOUNT NO.:  [id number]  MEDICAL RECORD NO.:  [id number]  LOCATION:                                 FACILITY:  PHYSICIAN:  Daryll Brod, M.D.            DATE OF BIRTH:  DATE OF PROCEDURE:  10/08/2015 DATE OF DISCHARGE:                              OPERATIVE REPORT   PREOPERATIVE DIAGNOSIS:  Fracture of the metacarpal, right ring finger.  POSTOPERATIVE DIAGNOSIS:  Fracture of the metacarpal, right ring finger.  OPERATION:  Open reduction and internal fixation of fracture, right ring finger metacarpal.  SURGEON:  Daryll Brod, MD.  ANESTHESIA:  General with local infiltration.  ANESTHESIOLOGIST:  Cleon Dew. Kalman Shan, M.D.  HISTORY:  The patient is a 69 year old male who suffered a twisting injury resulting in a fracture of his ring finger metacarpal.  This is displaced.  He has elected to undergo open reduction and internal fixation.  Pre, peri, and postoperative course have been discussed along with risks and complications.  He is aware that there is no guarantee with the surgery, possibility of infection, recurrence of injury to arteries, nerves, tendons, incomplete relief of symptoms and dystrophy. In the preoperative area, the patient is seen, the extremity marked by both patient and surgeon.  Antibiotic given.  PROCEDURE IN DETAIL:  The patient was brought to the operating room, where a general anesthetic was carried out without difficulty under the direction of Dr. Kalman Shan.  He was prepped using ChloraPrep, supine position with the right arm free.  A 3-minute dry time was allowed.  Time-out taken, confirming the patient and procedure.  The limb was exsanguinated with an Esmarch bandage.  Tourniquet placed high on the arm was inflated to 250 mmHg.  A longitudinal incision was made over the fourth metacarpal right hand and carried down through subcutaneous tissue. Bleeders were electrocauterized with bipolar.  Neurovascular structures identified  and protected.  Retractors were placed between the extensor tendon to the ring and small finger.  This allowed visualization of the area of the fourth metacarpal.  An incision made through the periosteum. The fracture was identified proximally.  With a blunt and sharp dissection, the periosteum elevated.  Granulation tissue and scarring was removed.  The fracture was then opened, curetted.  This was then reduced and clamped with a phalangeal clamp.  X-ray was taken of the AP, lateral, and oblique fracture revealed that the fracture was well reduced.  Flexion and extension of his fingers revealed no angulatory or overlap deformity.  The 1.3 modular screws were then selected.  Drill holes were placed using 1-mm drill and 4 screws were placed measuring 10 to 8 mm.  This firmly fixed our fracture.  X-ray taken in the AP, lateral and oblique direction revealed the fracture was reduced, screws in good position.  The fracture line was not visible.  The wound was copiously irrigated with saline.  The finger placed through a full range of motion, with no angulatory overlap or rotatory deformity noted.  The periosteum was then closed with a running 4-0 Vicryl suture.  The skin closed with interrupted 4-0 nylon sutures.  A local infiltration with 0.25% bupivacaine without epinephrine was given,  approximately 8 mL was used.  A sterile compressive dressing, dorsal palmar splint to the middle, ring, and little finger was applied.  On deflation of the tourniquet, all fingers immediately pinked.  He was taken to the recovery room for observation in satisfactory condition.  He will be discharged home to return to the [facility] in 1 week on Norco.          ______________________________ Daryll Brod, M.D.     GK/MEDQ  D:  10/08/2015  T:  10/08/2015  Job:  [id number]

## 2015-10-08 NOTE — H&P (Signed)
  Mike Wong is a 69 year-old right-hand dominant male who was using a drill on November 2nd when it caught, twisting his hand, while at work.  He complains of pain over his right hand, tenderness over the ring finger metacarpal.  He has no prior history of injuries.  He had x-rays done revealing a fractured metacarpal and was referred by Dr. Owens Shark.  He has no prior history of injuries, no history of diabetes or thyroid problems. He does have history of arthritis, no history of gout.    REVIEW OF SYSTEMS2:   Negative 14 points. Mike Wong is an 69 y.o. male.   Chief Complaint: fracture right hand HPI: see above  Past Medical History  Diagnosis Date  . Finger fracture, right     5th finger  . High cholesterol   . Hypertension     Past Surgical History  Procedure Laterality Date  . Electrocardiogram  03/16/03  . Colonoscopy  11/04/06    History reviewed. No pertinent family history. Social History:  reports that he has quit smoking. He does not have any smokeless tobacco history on file. He reports that he does not drink alcohol or use illicit drugs.  Allergies: No Known Allergies  No prescriptions prior to admission    No results found for this or any previous visit (from the past 48 hour(s)).  No results found.   Pertinent items are noted in HPI.  Height 5\' 10"  (1.778 m), weight 79.379 kg (175 lb).  General appearance: alert, cooperative and appears stated age Head: Normocephalic, without obvious abnormality Neck: no JVD Resp: clear to auscultation bilaterally Cardio: regular rate and rhythm, S1, S2 normal, no murmur, click, rub or gallop GI: soft, non-tender; bowel sounds normal; no masses,  no organomegaly Extremities: pain right hand Pulses: 2+ and symmetric Skin: Skin color, texture, turgor normal. No rashes or lesions Neurologic: Grossly normal Incision/Wound: na  Assessment/Plan RADIOGRAPHS:    X-rays reveal a fracture of his ring finger  metacarpal with displacement.  DIAGNOSIS:      Ring finger metacarpal fracture, right hand.  RECOMMENDATIONS/PLAN:    Plan is for ORIF right ring finger metacarpal.  The pre, peri and postoperative course were discussed along with the risks and complications.  The patient is aware there is no guarantee with the surgery, possibility of infection, recurrence, injury to arteries, nerves, tendons, incomplete relief of symptoms and dystrophy, the possibility of stiffness.  He will be at one-handed work for approximately eight weeks.   Noelene Gang R 10/08/2015, 9:35 AM

## 2015-10-08 NOTE — Brief Op Note (Signed)
10/08/2015  11:36 AM  PATIENT:  Horton Finer Nordstrom  69 y.o. male  PRE-OPERATIVE DIAGNOSIS:  FRACTURE RIGHT RING FINGER METACARPAL  POST-OPERATIVE DIAGNOSIS:  FRACTURE RIGHT RING FINGER METACARPAL  PROCEDURE:  Procedure(s): OPEN REDUCTION INTERNAL FIXATION (ORIF) METACARPAL RIGHT RING FINGER (Right)  SURGEON:  Surgeon(s) and Role:    * Daryll Brod, MD - Primary  PHYSICIAN ASSISTANT:   ASSISTANTS: none   ANESTHESIA:   local and general  EBL:     BLOOD ADMINISTERED:none  DRAINS: none   LOCAL MEDICATIONS USED:  BUPIVICAINE   SPECIMEN:  No Specimen  DISPOSITION OF SPECIMEN:  N/A  COUNTS:  YES  TOURNIQUET:   Total Tourniquet Time Documented: Upper Arm (Right) - 41 minutes Total: Upper Arm (Right) - 41 minutes   DICTATION: .Other Dictation: Dictation Number [id number]  PLAN OF CARE: Discharge to home after PACU  PATIENT DISPOSITION:  PACU - hemodynamically stable.

## 2015-10-08 NOTE — Transfer of Care (Signed)
Immediate Anesthesia Transfer of Care Note  Patient: Mike Wong  Procedure(s) Performed: Procedure(s): OPEN REDUCTION INTERNAL FIXATION (ORIF) METACARPAL RIGHT RING FINGER (Right)  Patient Location: PACU  Anesthesia Type:General  Level of Consciousness: sedated  Airway & Oxygen Therapy: Patient Spontanous Breathing and Patient connected to face mask oxygen  Post-op Assessment: Report given to RN and Post -op Vital signs reviewed and stable  Post vital signs: Reviewed and stable  Last Vitals:  Filed Vitals:   10/08/15 1138 10/08/15 1140  BP: 117/71   Pulse:  82  Temp:    Resp:      Complications: No apparent anesthesia complications

## 2015-10-08 NOTE — Anesthesia Procedure Notes (Signed)
Procedure Name: LMA Insertion Date/Time: 10/08/2015 10:41 AM Performed by: Zan Orlick D Pre-anesthesia Checklist: Patient identified, Emergency Drugs available, Suction available and Patient being monitored Patient Re-evaluated:Patient Re-evaluated prior to inductionOxygen Delivery Method: Circle System Utilized Preoxygenation: Pre-oxygenation with 100% oxygen Intubation Type: IV induction Ventilation: Mask ventilation without difficulty LMA: LMA inserted LMA Size: 4.0 Number of attempts: 1 Airway Equipment and Method: Bite block Placement Confirmation: positive ETCO2 Tube secured with: Tape Dental Injury: Teeth and Oropharynx as per pre-operative assessment

## 2015-10-08 NOTE — Op Note (Addendum)
Dictation Number [id number]  Intra-operative fluoroscopic images in the AP, lateral, and oblique views were taken and evaluated by myself.  Reduction and hardware placement were confirmed.

## 2015-10-09 ENCOUNTER — Encounter (HOSPITAL_BASED_OUTPATIENT_CLINIC_OR_DEPARTMENT_OTHER): Payer: Self-pay | Admitting: Orthopedic Surgery

## 2015-10-16 DIAGNOSIS — S62609K Fracture of unspecified phalanx of unspecified finger, subsequent encounter for fracture with nonunion: Secondary | ICD-10-CM | POA: Insufficient documentation

## 2015-10-16 DIAGNOSIS — Z9889 Other specified postprocedural states: Secondary | ICD-10-CM | POA: Insufficient documentation

## 2016-02-15 DIAGNOSIS — I724 Aneurysm of artery of lower extremity: Secondary | ICD-10-CM

## 2016-02-15 HISTORY — DX: Aneurysm of artery of lower extremity: I72.4

## 2016-02-24 ENCOUNTER — Encounter (HOSPITAL_COMMUNITY): Payer: Self-pay | Admitting: *Deleted

## 2016-02-24 ENCOUNTER — Inpatient Hospital Stay (HOSPITAL_COMMUNITY)
Admission: EM | Admit: 2016-02-24 | Discharge: 2016-02-27 | DRG: 301 | Disposition: A | Discharge status: Home / Self Care | Payer: Medicare Other | Attending: Vascular Surgery | Admitting: Vascular Surgery

## 2016-02-24 DIAGNOSIS — I724 Aneurysm of artery of lower extremity: Secondary | ICD-10-CM | POA: Diagnosis not present

## 2016-02-24 DIAGNOSIS — I998 Other disorder of circulatory system: Secondary | ICD-10-CM

## 2016-02-24 DIAGNOSIS — I10 Essential (primary) hypertension: Secondary | ICD-10-CM | POA: Diagnosis present

## 2016-02-24 DIAGNOSIS — Z87891 Personal history of nicotine dependence: Secondary | ICD-10-CM | POA: Diagnosis not present

## 2016-02-24 DIAGNOSIS — M79606 Pain in leg, unspecified: Secondary | ICD-10-CM | POA: Diagnosis not present

## 2016-02-24 DIAGNOSIS — I82491 Acute embolism and thrombosis of other specified deep vein of right lower extremity: Secondary | ICD-10-CM | POA: Diagnosis not present

## 2016-02-24 HISTORY — DX: Aneurysm of artery of lower extremity: I72.4

## 2016-02-24 LAB — PROTIME-INR
INR: 1.01 (ref 0.00–1.49)
Prothrombin Time: 13.5 seconds (ref 11.6–15.2)

## 2016-02-24 LAB — CBC WITH DIFFERENTIAL/PLATELET
BASOS ABS: 0 10*3/uL (ref 0.0–0.1)
BASOS PCT: 0 %
Eosinophils Absolute: 0.1 10*3/uL (ref 0.0–0.7)
Eosinophils Relative: 1 %
HEMATOCRIT: 41.9 % (ref 39.0–52.0)
HEMOGLOBIN: 14.3 g/dL (ref 13.0–17.0)
Lymphocytes Relative: 19 %
Lymphs Abs: 1.7 10*3/uL (ref 0.7–4.0)
MCH: 30.8 pg (ref 26.0–34.0)
MCHC: 34.1 g/dL (ref 30.0–36.0)
MCV: 90.1 fL (ref 78.0–100.0)
MONOS PCT: 5 %
Monocytes Absolute: 0.4 10*3/uL (ref 0.1–1.0)
NEUTROS ABS: 6.5 10*3/uL (ref 1.7–7.7)
NEUTROS PCT: 75 %
Platelets: 242 10*3/uL (ref 150–400)
RBC: 4.65 MIL/uL (ref 4.22–5.81)
RDW: 12.7 % (ref 11.5–15.5)
WBC: 8.7 10*3/uL (ref 4.0–10.5)

## 2016-02-24 LAB — BASIC METABOLIC PANEL
ANION GAP: 11 (ref 5–15)
BUN: 15 mg/dL (ref 6–20)
CHLORIDE: 104 mmol/L (ref 101–111)
CO2: 25 mmol/L (ref 22–32)
Calcium: 9.7 mg/dL (ref 8.9–10.3)
Creatinine, Ser: 1.09 mg/dL (ref 0.61–1.24)
GFR calc non Af Amer: >60 mL/min (ref >60)
GLUCOSE: 116 mg/dL — AB (ref 65–99)
POTASSIUM: 3.4 mmol/L — AB (ref 3.5–5.1)
Sodium: 140 mmol/L (ref 135–145)

## 2016-02-24 LAB — APTT: APTT: 29 s (ref 24–37)

## 2016-02-24 MED ORDER — IOPAMIDOL (ISOVUE-370) INJECTION 76%
INTRAVENOUS | Status: AC
  Administered 2016-02-25: 100 mL
  Filled 2016-02-24: qty 100

## 2016-02-24 NOTE — ED Provider Notes (Signed)
CSN: [id number]     Arrival date & time 02/24/16  2231 History   First MD Initiated Contact with Patient 02/24/16 2253     Chief Complaint  Patient presents with  . Leg Pain     (Consider location/radiation/quality/duration/timing/severity/associated sxs/prior Treatment) Patient is a 70 y.o. male presenting with lower extremity pain. The history is provided by the patient.  Foot Pain This is a new problem. The current episode started 1 to 2 hours ago. The problem occurs constantly. The problem has not changed since onset.Pertinent negatives include no chest pain, no abdominal pain and no shortness of breath. Associated symptoms comments: Foot has turned white. The symptoms are aggravated by standing. Nothing relieves the symptoms. He has tried nothing for the symptoms.    Past Medical History  Diagnosis Date  . Finger fracture, right     5th finger  . High cholesterol   . Hypertension    Past Surgical History  Procedure Laterality Date  . Electrocardiogram  03/16/03  . Colonoscopy  11/04/06  . Open reduction internal fixation (orif) metacarpal Right 10/08/2015    Procedure: OPEN REDUCTION INTERNAL FIXATION (ORIF) METACARPAL RIGHT RING FINGER;  Surgeon: Daryll Brod, MD;  Location: [facility];  Service: Orthopedics;  Laterality: Right;   History reviewed. No pertinent family history. Social History  Substance Use Topics  . Smoking status: Former Research scientist (life sciences)  . Smokeless tobacco: None  . Alcohol Use: No    Review of Systems  Respiratory: Negative for shortness of breath.   Cardiovascular: Negative for chest pain.  Gastrointestinal: Negative for abdominal pain.  All other systems reviewed and are negative.     Allergies  Review of patient's allergies indicates no known allergies.  Home Medications   Prior to Admission medications   Medication Sig Start Date End Date Taking? Authorizing Provider  amLODipine (NORVASC) 10 MG tablet Take 1 tablet (10 mg total) by  mouth daily. 03/19/15   [name], MD  atorvastatin (LIPITOR) 10 MG tablet TAKE 1 TABLET BY MOUTH EVERY DAY 08/19/15   [name], MD  HYDROcodone-acetaminophen [facility]) 5-325 MG tablet Take 1 tablet by mouth every 6 (six) hours as needed for moderate pain. 10/08/15   Daryll Brod, MD   BP 174/104 mmHg  Pulse 105  Temp(Src) 98.7 F (37.1 C) (Oral)  Resp 18  Ht 5\' 11"  (1.803 m)  Wt 175 lb (79.379 kg)  BMI 24.42 kg/m2  SpO2 98% Physical Exam  Constitutional: He is oriented to person, place, and time. He appears well-developed and well-nourished. No distress.  HENT:  Head: Normocephalic and atraumatic.  Eyes: Conjunctivae are normal.  Neck: Neck supple. No tracheal deviation present.  Cardiovascular: Normal rate and regular rhythm.   Pulses:      Femoral pulses are 2+ on the right side.      Popliteal pulses are 3+ on the right side.       Dorsalis pedis pulses are 0 on the right side.       Posterior tibial pulses are 0 on the right side, and 2+ on the left side.  Pulmonary/Chest: Effort normal. No respiratory distress.  Abdominal: Soft. He exhibits no distension.  Musculoskeletal:  Right foot pallor with absent cap refill. Maintains normal ROM and sensation in toes  Neurological: He is alert and oriented to person, place, and time.  Skin: Skin is warm and dry.  Psychiatric: He has a normal mood and affect.    ED Course  Procedures (including critical  care time) Labs Review Labs Reviewed  BASIC METABOLIC PANEL - Abnormal; Notable for the following:    Potassium 3.4 (*)    Glucose, Bld 116 (*)    All other components within normal limits  CBC WITH DIFFERENTIAL/PLATELET  PROTIME-INR  APTT  HEPARIN LEVEL (UNFRACTIONATED)    Imaging Review No results found. I have personally reviewed and evaluated these images and lab results as part of my medical decision-making.   EKG Interpretation   Date/Time:  Monday February 24 2016 23:12:05 EDT Ventricular Rate:  90 PR  Interval:  152 QRS Duration: 93 QT Interval:  378 QTC Calculation: 462 R Axis:   15 Text Interpretation:  Sinus rhythm Borderline T wave abnormalities  Otherwise normal ECG No previous tracing Confirmed by Basya Casavant MD, Raelea Gosse  [id number]) on 02/24/2016 11:26:35 PM      MDM   Final diagnoses:  Lower limb ischemia   70 y.o. male presents with Sudden onset pain to his right foot that started 1-1-1/2 hours ago, his foot has been white in color. On arrival no palpable DP or PT pulses present. No detectable capillary refill on exam. Maintains full sensation and movement of his feet distally. Palpable PT on the left is strong. No history of atrial fibrillation or other embolic risk factors. Vascular surgery was consulted for clinically ischemic limb and will see the patient in the emergency department. They recommended EKG screening which was performed.  11:39 PM Vascular at bedside assessing Pt.  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 Placed on therapeutic heparin. CT with runoff ordered. Dr Oneida Alar to admit for further management.   Dr Dina Rich aware of patient pending final vascular disposition following CT study.  Leo Grosser, MD 02/25/16 [phone]

## 2016-02-24 NOTE — H&P (Addendum)
Referring Physician: Frances Nickels [place] Patient name: Mike Wong MRN: [id number] DOB: 15-Mar-1946 Sex: male  REASON FOR CONSULT: acute ischemia right leg  HPI: Mike Wong is a 70 y.o. male, with 2 hour history of coolness right foot.  Denies loss of sensation or motor function.  No prior episodes.  No history of claudication. No family history of aneurysm. This was sudden onset. Other medical problems include hypertension and elevated cholesterol.  Denies history of MI or stroke.  No history of irregular heartbeat.  He has a remote history of smoking quit 20-30 yrs ago.  Past Medical History  Diagnosis Date  . Finger fracture, right     5th finger  . High cholesterol   . Hypertension    Past Surgical History  Procedure Laterality Date  . Electrocardiogram  03/16/03  . Colonoscopy  11/04/06  . Open reduction internal fixation (orif) metacarpal Right 10/08/2015    Procedure: OPEN REDUCTION INTERNAL FIXATION (ORIF) METACARPAL RIGHT RING FINGER;  Surgeon: Daryll Brod, MD;  Location: [facility];  Service: Orthopedics;  Laterality: Right;    History reviewed. No pertinent family history.  SOCIAL HISTORY: Social History   Social History  . Marital Status: Married    Spouse Name: N/A  . Number of Children: N/A  . Years of Education: N/A   Occupational History  . Not on file.   Social History Main Topics  . Smoking status: Former Research scientist (life sciences)  . Smokeless tobacco: Not on file  . Alcohol Use: No  . Drug Use: No  . Sexual Activity: Yes   Other Topics Concern  . Not on file   Social History Narrative    No Known Allergies  No current facility-administered medications for this encounter.   Current Outpatient Prescriptions  Medication Sig Dispense Refill  . amLODipine (NORVASC) 10 MG tablet Take 1 tablet (10 mg total) by mouth daily. 90 tablet 3  . atorvastatin (LIPITOR) 10 MG tablet TAKE 1 TABLET BY MOUTH EVERY DAY 90 tablet 3  .  HYDROcodone-acetaminophen (NORCO) 5-325 MG tablet Take 1 tablet by mouth every 6 (six) hours as needed for moderate pain. 30 tablet 0    ROS:   General:  No weight loss, Fever, chills  HEENT: No recent headaches, no nasal bleeding, no visual changes, no sore throat  Neurologic: No dizziness, blackouts, seizures. No recent symptoms of stroke or mini- stroke. No recent episodes of slurred speech, or temporary blindness.  Cardiac: No recent episodes of chest pain/pressure, no shortness of breath at rest.  No shortness of breath with exertion.  Denies history of atrial fibrillation or irregular heartbeat  Vascular: No history of rest pain in feet.  No history of claudication.  No history of non-healing ulcer, No history of DVT   Pulmonary: No home oxygen, no productive cough, no hemoptysis,  No asthma or wheezing  Musculoskeletal:  [ ]  Arthritis, [ ]  Low back pain,  [ ]  Joint pain  Hematologic:No history of hypercoagulable state.  No history of easy bleeding.  No history of anemia  Gastrointestinal: No hematochezia or melena,  No gastroesophageal reflux, no trouble swallowing  Urinary: [ ]  chronic Kidney disease, [ ]  on HD - [ ]  MWF or [ ]  TTHS, [ ]  Burning with urination, [ ]  Frequent urination, [ ]  Difficulty urinating;   Skin: No rashes  Psychological: No history of anxiety,  No history of depression   Physical Examination  Filed Vitals:   02/24/16 2238  BP: 174/104  Pulse: 105  Temp: 98.7 F (37.1 C)  TempSrc: Oral  Resp: 18  Height: 5\' 11"  (1.803 m)  Weight: 175 lb (79.379 kg)  SpO2: 98%    Body mass index is 24.42 kg/(m^2).  General:  Alert and oriented, no acute distress HEENT: Normal Neck: No bruit or JVD Pulmonary: Clear to auscultation bilaterally Cardiac: Regular Rate and Rhythm Abdomen: Soft, non-tender, non-distended, no mass, no scars Skin: No rash Extremity Pulses:  2+ radial, brachial, femoral,3+ popliteal 2+ left dorsalis pedis, posterior tibial  pulse absent right DP PT pulse no doppler signals foot pale cool on right compared to left from mid calf down Musculoskeletal: No deformity or edema  Neurologic: Upper and lower extremity motor 5/5 and symmetric  DATA:  EKG sinus rhythm  BMET    Component Value Date/Time   NA 140 02/24/2016 2319   K 3.4* 02/24/2016 2319   CL 104 02/24/2016 2319   CO2 25 02/24/2016 2319   GLUCOSE 116* 02/24/2016 2319   BUN 15 02/24/2016 2319   CREATININE 1.09 02/24/2016 2319   CREATININE 0.96 03/19/2015 0914   CALCIUM 9.7 02/24/2016 2319   GFRNONAA >60 02/24/2016 2319   GFRAA >60 02/24/2016 2319    CBC    Component Value Date/Time   WBC 8.7 02/24/2016 2319   RBC 4.65 02/24/2016 2319   HGB 14.3 02/24/2016 2319   HCT 41.9 02/24/2016 2319   PLT 242 02/24/2016 2319   MCV 90.1 02/24/2016 2319   MCH 30.8 02/24/2016 2319   MCHC 34.1 02/24/2016 2319   RDW 12.7 02/24/2016 2319   LYMPHSABS 1.7 02/24/2016 2319   MONOABS 0.4 02/24/2016 2319   EOSABS 0.1 02/24/2016 2319   BASOSABS 0.0 02/24/2016 2319   ASSESSMENT:  Acute ischemia right leg suspicious for thrombosed popliteal aneurysm vs acute embolic event   PLAN:  1.  Heparin drip now  2.  CTA with bilat runoff stat  3. Will decide on thrombolysis vs direct OR depending on CT since currently no decrease in sensation or motor function   Ruta Hinds, MD [facility] Office: [phone] Pager: [phone]   Addendum: CT images reviewed 16 mm right popliteal aneurysm with thrombus in the DP and TPT.  Have discussed case with Dr Reesa Chew from interventional radiology who will attempt thrombolysis to restore distal tibial flow with interval repair of popliteal aneurysm  Plan discussed with pt.  No real change in symptoms.  Neuro motor sensory still intact.  Heparin being hung now.  Ruta Hinds, MD [facility] [place] Office: [phone] Pager: [phone]

## 2016-02-24 NOTE — ED Notes (Signed)
Pt in c/o pain to his right lower leg that started tonight, reports swelling to bilateral legs over the last few days- right ankle and foot are white, no pedal pulses palpable, capillary refill to right foot >5 seconds, pt taken straight back to exam room, EDP notified of symptoms.

## 2016-02-24 NOTE — Progress Notes (Signed)
ANTICOAGULATION CONSULT NOTE - Initial Consult  Pharmacy Consult for Heparin Indication: Acute ischemia right leg suspicious for thrombosed popliteal aneurysm vs acute embolic event  No Known Allergies  Patient Measurements: Height: 5\' 11"  (180.3 cm) Weight: 175 lb (79.379 kg) IBW/kg (Calculated) : 75.3 Heparin Dosing Weight: 79 kg  Vital Signs: Temp: 98.7 F (37.1 C) (04/10 2238) Temp Source: Oral (04/10 2238) BP: 148/92 mmHg (04/10 2345) Pulse Rate: 83 (04/10 2345)  Labs:  Recent Labs  02/24/16 2319  HGB 14.3  HCT 41.9  PLT 242  APTT 29  LABPROT 13.5  INR 1.01  CREATININE 1.09    Estimated Creatinine Clearance: 69.1 mL/min (by C-G formula based on Cr of 1.09).   Medical History: Past Medical History  Diagnosis Date  . Finger fracture, right     5th finger  . High cholesterol   . Hypertension     Medications:  See electronic med rec  Assessment: 70 y.o. M presents with coolness in the R foot. VVS consulted. To begin heparin for Acute ischemia right leg suspicious for thrombosed popliteal aneurysm vs acute embolic event. No AC PTA. CBC ok on admission. INR 1.01.   Goal of Therapy:  Heparin level 0.3-0.7 units/ml Monitor platelets by anticoagulation protocol: Yes   Plan:  Heparin IV bolus 4000 units Heparin gtt at 1250 units/hr Will f/u heparin level in 6 hours Daily heparin level and CBC  Sherlon Handing, PharmD, BCPS Clinical pharmacist, pager [phone] 02/24/2016,11:55 PM

## 2016-02-25 ENCOUNTER — Inpatient Hospital Stay (HOSPITAL_COMMUNITY): Payer: Medicare Other

## 2016-02-25 ENCOUNTER — Emergency Department (HOSPITAL_COMMUNITY): Payer: Medicare Other

## 2016-02-25 ENCOUNTER — Encounter (HOSPITAL_COMMUNITY): Payer: Self-pay | Admitting: Radiology

## 2016-02-25 DIAGNOSIS — I724 Aneurysm of artery of lower extremity: Secondary | ICD-10-CM | POA: Diagnosis not present

## 2016-02-25 DIAGNOSIS — I998 Other disorder of circulatory system: Secondary | ICD-10-CM | POA: Diagnosis not present

## 2016-02-25 DIAGNOSIS — Z87891 Personal history of nicotine dependence: Secondary | ICD-10-CM | POA: Diagnosis not present

## 2016-02-25 DIAGNOSIS — M79606 Pain in leg, unspecified: Secondary | ICD-10-CM | POA: Diagnosis not present

## 2016-02-25 DIAGNOSIS — I82491 Acute embolism and thrombosis of other specified deep vein of right lower extremity: Secondary | ICD-10-CM | POA: Diagnosis not present

## 2016-02-25 DIAGNOSIS — I743 Embolism and thrombosis of arteries of the lower extremities: Secondary | ICD-10-CM | POA: Diagnosis not present

## 2016-02-25 DIAGNOSIS — I10 Essential (primary) hypertension: Secondary | ICD-10-CM | POA: Diagnosis present

## 2016-02-25 LAB — CBC
HCT: 39.6 % (ref 39.0–52.0)
HEMATOCRIT: 38.6 % — AB (ref 39.0–52.0)
HEMATOCRIT: 39.1 % (ref 39.0–52.0)
HEMATOCRIT: 39.6 % (ref 39.0–52.0)
HEMOGLOBIN: 13.6 g/dL (ref 13.0–17.0)
HEMOGLOBIN: 13.5 g/dL (ref 13.0–17.0)
Hemoglobin: 13.8 g/dL (ref 13.0–17.0)
Hemoglobin: 13.4 g/dL (ref 13.0–17.0)
MCH: 31.4 pg (ref 26.0–34.0)
MCH: 31.4 pg (ref 26.0–34.0)
MCH: 30.5 pg (ref 26.0–34.0)
MCH: 30.8 pg (ref 26.0–34.0)
MCHC: 35.2 g/dL (ref 30.0–36.0)
MCHC: 35.3 g/dL (ref 30.0–36.0)
MCHC: 33.8 g/dL (ref 30.0–36.0)
MCHC: 34.1 g/dL (ref 30.0–36.0)
MCV: 89.1 fL (ref 78.0–100.0)
MCV: 89.1 fL (ref 78.0–100.0)
MCV: 90.2 fL (ref 78.0–100.0)
MCV: 90.2 fL (ref 78.0–100.0)
PLATELETS: 209 10*3/uL (ref 150–400)
Platelets: 236 10*3/uL (ref 150–400)
Platelets: 222 10*3/uL (ref 150–400)
Platelets: 217 10*3/uL (ref 150–400)
RBC: 4.33 MIL/uL (ref 4.22–5.81)
RBC: 4.39 MIL/uL (ref 4.22–5.81)
RBC: 4.39 MIL/uL (ref 4.22–5.81)
RBC: 4.39 MIL/uL (ref 4.22–5.81)
RDW: 12.7 % (ref 11.5–15.5)
RDW: 12.7 % (ref 11.5–15.5)
RDW: 12.8 % (ref 11.5–15.5)
RDW: 12.7 % (ref 11.5–15.5)
WBC: 10.5 10*3/uL (ref 4.0–10.5)
WBC: 8.9 10*3/uL (ref 4.0–10.5)
WBC: 10.1 10*3/uL (ref 4.0–10.5)
WBC: 9.6 10*3/uL (ref 4.0–10.5)

## 2016-02-25 LAB — COMPREHENSIVE METABOLIC PANEL
ALK PHOS: 75 U/L (ref 38–126)
ALT: 26 U/L (ref 17–63)
AST: 33 U/L (ref 15–41)
Albumin: 3.9 g/dL (ref 3.5–5.0)
Anion gap: 12 (ref 5–15)
BUN: 10 mg/dL (ref 6–20)
CALCIUM: 9 mg/dL (ref 8.9–10.3)
CO2: 26 mmol/L (ref 22–32)
CREATININE: 1.04 mg/dL (ref 0.61–1.24)
Chloride: 103 mmol/L (ref 101–111)
GFR calc non Af Amer: >60 mL/min (ref >60)
GLUCOSE: 123 mg/dL — AB (ref 65–99)
Potassium: 3.4 mmol/L — ABNORMAL LOW (ref 3.5–5.1)
SODIUM: 141 mmol/L (ref 135–145)
Total Bilirubin: 0.7 mg/dL (ref 0.3–1.2)
Total Protein: 6.8 g/dL (ref 6.5–8.1)

## 2016-02-25 LAB — MRSA PCR SCREENING: MRSA BY PCR: NEGATIVE

## 2016-02-25 LAB — URINALYSIS, ROUTINE W REFLEX MICROSCOPIC
BILIRUBIN URINE: NEGATIVE
GLUCOSE, UA: NEGATIVE mg/dL
Hgb urine dipstick: NEGATIVE
KETONES UR: 15 mg/dL — AB
Leukocytes, UA: NEGATIVE
NITRITE: NEGATIVE
PH: 7 (ref 5.0–8.0)
Protein, ur: NEGATIVE mg/dL
Specific Gravity, Urine: 1.015 (ref 1.005–1.030)

## 2016-02-25 LAB — FIBRINOGEN
FIBRINOGEN: 342 mg/dL (ref 204–475)
Fibrinogen: 346 mg/dL (ref 204–475)
Fibrinogen: 331 mg/dL (ref 204–475)
Fibrinogen: 335 mg/dL (ref 204–475)

## 2016-02-25 MED ORDER — DOCUSATE SODIUM 100 MG PO CAPS
100.0000 mg | ORAL_CAPSULE | Freq: Two times a day (BID) | ORAL | Status: DC
  Filled 2016-02-25: qty 1

## 2016-02-25 MED ORDER — HEPARIN (PORCINE) IN NACL 100-0.45 UNIT/ML-% IJ SOLN
1200.0000 [IU]/h | INTRAMUSCULAR | Status: DC
  Administered 2016-02-25 – 2016-02-26 (×3): 800 [IU]/h via INTRAVENOUS
  Filled 2016-02-25 (×4): qty 250

## 2016-02-25 MED ORDER — ATORVASTATIN CALCIUM 10 MG PO TABS
10.0000 mg | ORAL_TABLET | Freq: Every day | ORAL | Status: DC
  Administered 2016-02-25 – 2016-02-27 (×3): 10 mg via ORAL
  Filled 2016-02-25 (×3): qty 1

## 2016-02-25 MED ORDER — MORPHINE SULFATE (PF) 2 MG/ML IV SOLN
2.0000 mg | INTRAVENOUS | Status: DC | PRN
  Administered 2016-02-26 (×2): 2 mg via INTRAVENOUS
  Filled 2016-02-25 (×2): qty 1

## 2016-02-25 MED ORDER — ALUM & MAG HYDROXIDE-SIMETH 200-200-20 MG/5ML PO SUSP: 15.0000 mL | ORAL | Status: DC | PRN

## 2016-02-25 MED ORDER — HYDRALAZINE HCL 20 MG/ML IJ SOLN: 5.0000 mg | INTRAMUSCULAR | Status: DC | PRN

## 2016-02-25 MED ORDER — FENTANYL CITRATE (PF) 100 MCG/2ML IJ SOLN
INTRAMUSCULAR | Status: AC | PRN
  Administered 2016-02-25: 50 ug via INTRAVENOUS
  Administered 2016-02-25 (×2): 25 ug via INTRAVENOUS

## 2016-02-25 MED ORDER — LABETALOL HCL 5 MG/ML IV SOLN: 10.0000 mg | INTRAVENOUS | Status: DC | PRN

## 2016-02-25 MED ORDER — SODIUM CHLORIDE 0.9% FLUSH: 3.0000 mL | INTRAVENOUS | Status: DC | PRN

## 2016-02-25 MED ORDER — IOPAMIDOL (ISOVUE-300) INJECTION 61%
INTRAVENOUS | Status: AC
  Administered 2016-02-25: 100 mL
  Filled 2016-02-25: qty 50

## 2016-02-25 MED ORDER — METOPROLOL TARTRATE 1 MG/ML IV SOLN: 2.0000 mg | INTRAVENOUS | Status: DC | PRN

## 2016-02-25 MED ORDER — POTASSIUM CHLORIDE CRYS ER 20 MEQ PO TBCR
20.0000 meq | EXTENDED_RELEASE_TABLET | Freq: Once | ORAL | Status: AC
  Administered 2016-02-25: 40 meq via ORAL
  Filled 2016-02-25: qty 2

## 2016-02-25 MED ORDER — FENTANYL CITRATE (PF) 100 MCG/2ML IJ SOLN
INTRAMUSCULAR | Status: AC
  Filled 2016-02-25: qty 2

## 2016-02-25 MED ORDER — PANTOPRAZOLE SODIUM 40 MG PO TBEC
40.0000 mg | DELAYED_RELEASE_TABLET | Freq: Every day | ORAL | Status: DC
  Administered 2016-02-25 – 2016-02-27 (×3): 40 mg via ORAL
  Filled 2016-02-25 (×3): qty 1

## 2016-02-25 MED ORDER — PHENOL 1.4 % MT LIQD
1.0000 | OROMUCOSAL | Status: DC | PRN
Start: 2016-02-25 — End: 2016-02-27

## 2016-02-25 MED ORDER — HEPARIN BOLUS VIA INFUSION
4000.0000 [IU] | Freq: Once | INTRAVENOUS | Status: AC
  Administered 2016-02-25: 4000 [IU] via INTRAVENOUS
  Filled 2016-02-25: qty 4000

## 2016-02-25 MED ORDER — SODIUM CHLORIDE 0.9% FLUSH
3.0000 mL | Freq: Two times a day (BID) | INTRAVENOUS | Status: DC
  Administered 2016-02-25 – 2016-02-27 (×3): 3 mL via INTRAVENOUS

## 2016-02-25 MED ORDER — AMLODIPINE BESYLATE 10 MG PO TABS
10.0000 mg | ORAL_TABLET | Freq: Every day | ORAL | Status: DC
  Administered 2016-02-25 – 2016-02-27 (×3): 10 mg via ORAL
  Filled 2016-02-25 (×3): qty 1

## 2016-02-25 MED ORDER — SODIUM CHLORIDE 0.9 % IV SOLN: 250.0000 mL | INTRAVENOUS | Status: DC | PRN

## 2016-02-25 MED ORDER — IOPAMIDOL (ISOVUE-300) INJECTION 61%
INTRAVENOUS | Status: AC
  Administered 2016-02-25: 70 mL
  Filled 2016-02-25: qty 100

## 2016-02-25 MED ORDER — IOPAMIDOL (ISOVUE-300) INJECTION 61%
INTRAVENOUS | Status: AC
  Filled 2016-02-25: qty 100

## 2016-02-25 MED ORDER — LIDOCAINE HCL 1 % IJ SOLN
INTRAMUSCULAR | Status: AC
  Filled 2016-02-25: qty 20

## 2016-02-25 MED ORDER — DEXTROSE-NACL 5-0.45 % IV SOLN
INTRAVENOUS | Status: DC
  Administered 2016-02-25 – 2016-02-26 (×4): via INTRAVENOUS

## 2016-02-25 MED ORDER — ONDANSETRON HCL 4 MG/2ML IJ SOLN: 4.0000 mg | Freq: Four times a day (QID) | INTRAMUSCULAR | Status: DC | PRN

## 2016-02-25 MED ORDER — TENECTEPLASE 50 MG IV KIT
0.5000 mg/h | PACK | INTRAVENOUS | Status: DC
  Administered 2016-02-25 – 2016-02-26 (×3): 0.5 mg/h via INTRA_ARTERIAL
  Filled 2016-02-25 (×3): qty 1

## 2016-02-25 MED ORDER — GUAIFENESIN-DM 100-10 MG/5ML PO SYRP: 15.0000 mL | ORAL_SOLUTION | ORAL | Status: DC | PRN

## 2016-02-25 MED ORDER — MIDAZOLAM HCL 2 MG/2ML IJ SOLN
INTRAMUSCULAR | Status: AC
  Filled 2016-02-25: qty 2

## 2016-02-25 MED ORDER — HEPARIN (PORCINE) IN NACL 100-0.45 UNIT/ML-% IJ SOLN
1250.0000 [IU]/h | INTRAMUSCULAR | Status: DC
  Administered 2016-02-25: 1250 [IU]/h via INTRAVENOUS
  Filled 2016-02-25: qty 250

## 2016-02-25 MED ORDER — MIDAZOLAM HCL 2 MG/2ML IJ SOLN
INTRAMUSCULAR | Status: AC | PRN
  Administered 2016-02-25: 0.5 mg via INTRAVENOUS
  Administered 2016-02-25: 1 mg via INTRAVENOUS

## 2016-02-25 NOTE — Procedures (Signed)
Successful RLE ANGIO with infusion cath access into the thrombosed right peroneal artery No comp Stable Begin TNK lysis at 0.5mg /hr F/u angio 4/11 afternoon

## 2016-02-25 NOTE — Consult Note (Signed)
Chief Complaint: Cool rt foot.  Here for RLE angio and thrombolysis.  Referring Physician(s): fields  History of Present Illness: Mike Wong is a 70 y.o. male with acute RLE / foot pale cold foot.  Onset earlier this evening.  CTA in ED shows 42mm Rt pop aneurysm with TPT emboli and diminished flow to the Rt foot compared to the left.  CTA reviewed with Dr. Oneida Alar.  Agree with plan for catheter lysis.  Past Medical History  Diagnosis Date  . Finger fracture, right     5th finger  . High cholesterol   . Hypertension     Past Surgical History  Procedure Laterality Date  . Electrocardiogram  03/16/03  . Colonoscopy  11/04/06  . Open reduction internal fixation (orif) metacarpal Right 10/08/2015    Procedure: OPEN REDUCTION INTERNAL FIXATION (ORIF) METACARPAL RIGHT RING FINGER;  Surgeon: Daryll Brod, MD;  Location: [facility];  Service: Orthopedics;  Laterality: Right;    Allergies: Review of patient's allergies indicates no known allergies.  Medications: Prior to Admission medications   Medication Sig Start Date End Date Taking? Authorizing Provider  amLODipine (NORVASC) 10 MG tablet Take 1 tablet (10 mg total) by mouth daily. 03/19/15  Yes [name], MD  atorvastatin (LIPITOR) 10 MG tablet TAKE 1 TABLET BY MOUTH EVERY DAY 08/19/15  Yes [name], MD  HYDROcodone-acetaminophen (NORCO) 5-325 MG tablet Take 1 tablet by mouth every 6 (six) hours as needed for moderate pain. Patient not taking: Reported on 02/24/2016 10/08/15   Daryll Brod, MD     History reviewed. No pertinent family history.  Social History   Social History  . Marital Status: Married    Spouse Name: N/A  . Number of Children: N/A  . Years of Education: N/A   Social History Main Topics  . Smoking status: Former Research scientist (life sciences)  . Smokeless tobacco: None  . Alcohol Use: No  . Drug Use: No  . Sexual Activity: Yes   Other Topics Concern  . None   Social History Narrative     Review of Systems: A 12 point ROS discussed and pertinent positives are indicated in the HPI above.  All other systems are negative.  Review of Systems  Vital Signs: BP 143/87 mmHg  Pulse 86  Temp(Src) 98.7 F (37.1 C) (Oral)  Resp 20  Ht 5\' 11"  (1.803 m)  Wt 175 lb (79.379 kg)  BMI 24.42 kg/m2  SpO2 92%  Physical Exam  Constitutional: He appears well-developed and well-nourished. No distress.  Musculoskeletal: Normal range of motion. He exhibits no tenderness.  Cool rt foot with intact sensory, neuro, motor exam. Absent rt pedal pulses.  Skin: No rash noted. He is not diaphoretic. No erythema.    Mallampati Score:   2  Imaging: Ct Angio Ao+bifem W/cm &/or Wo/cm  02/25/2016  CLINICAL DATA:  Acute onset of right leg pain and ischemia. Assess for popliteal aneurysm. Initial encounter. EXAM: CT ANGIOGRAPHY OF ABDOMINAL AORTA WITH ILIOFEMORAL RUNOFF TECHNIQUE: Multidetector CT imaging of the abdomen, pelvis and lower extremities was performed using the standard protocol during bolus administration of intravenous contrast. Multiplanar CT image reconstructions and MIPs were obtained to evaluate the vascular anatomy. CONTRAST:  100 mL of Isovue 370 IV contrast COMPARISON:  None. FINDINGS: Aorta: Scattered calcification is noted along the abdominal aorta and its branches. The abdominal aorta remains fully patent. The celiac trunk, superior mesenteric artery, bilateral renal arteries and inferior mesenteric artery remain patent. Scattered calcification  is noted along the proximal left renal artery. Two right-sided renal arteries are seen. The common, external and internal iliac arteries appear patent bilaterally. The common femoral arteries are grossly unremarkable in appearance. The inferior vena cava is grossly unremarkable in appearance. Right Lower Extremity: The right profunda femoris artery remains patent. The superficial femoral artery is patent. A right popliteal artery aneurysm is  noted, measuring 2.6 cm in maximal diameter, best seen on sagittal images. The aneurysm sac is partially thrombosed. There appears to be nonocclusive thrombus within the right tibioperoneal trunk, with trace opacification of the branches of the tibioperoneal trunk at the level of the ankle. The patient has variant vascular anatomy, with only two vessels extending to the right ankle. As described above, there is minimal flow about the periphery of the tibioperoneal trunk. Left Lower Extremity: The left profunda femoris artery remains patent. The superficial femoral artery is unremarkable in appearance. The left popliteal artery is unremarkable. Mild calcification is noted along the left tibioperoneal trunk. There is variant vascular anatomy, with only two vessels extending to the left ankle, which both appear patent. Other Findings: Minimal bibasilar atelectasis is noted. A tiny hiatal hernia is seen. A few scattered hypodensities are noted within the liver, measuring up to 1.4 cm in size. The spleen is unremarkable in appearance. The gallbladder is within normal limits. The pancreas and adrenal glands are unremarkable. Nonspecific perinephric stranding is noted bilaterally. A 2.2 cm cyst is noted at the upper pole of the left kidney. The kidneys are otherwise unremarkable. There is no evidence of hydronephrosis. No renal or ureteral stones are seen. No free fluid is identified. The small bowel is unremarkable in appearance. The stomach is within normal limits. The appendix is normal in caliber and contains air, without evidence of appendicitis. The colon is unremarkable in appearance. The bladder is mildly distended and grossly remarkable. The prostate is enlarged, measuring 5.7 cm in transverse dimension, with scattered calcification. No inguinal lymphadenopathy is seen. The visualized musculature of the lower extremities is unremarkable in appearance. No acute osseous abnormalities are identified. Review of the  MIP images confirms the above findings. IMPRESSION: 1. Right popliteal artery aneurysm, measuring 2.6 cm in maximal diameter, best seen on sagittal images. The aneurysm sac is partially thrombosed. 2. Nonocclusive thrombus within the right tibioperoneal trunk, with trace opacification of the branches of the tibioperoneal trunk at the level of the ankle. The patient has two vessels extending to the right ankle, with only minimal flow noted at the tibioperoneal trunk. The anterior tibial artery remains patent. 3. Scattered calcification along the abdominal aorta and its branches, including at the proximal left renal artery. Mild calcification along the left tibioperoneal trunk. 4. Tiny hiatal hernia noted. 5. Nonspecific hypodensities within the liver, measuring up to 1.4 cm in size. 6. Small left renal cyst noted. 7. Enlarged prostate noted. These results were called by telephone at the time of interpretation on 02/25/2016 at 1:34 am to Dr. Dina Rich, who verbally acknowledged these results. Electronically Signed   By: Garald Balding M.D.   On: 02/25/2016 01:34    Labs:  CBC:  Recent Labs  03/19/15 0914 02/24/16 2319  WBC 6.1 8.7  HGB 14.3 14.3  HCT 42.4 41.9  PLT 276 242    COAGS:  Recent Labs  02/24/16 2319  INR 1.01  APTT 29    BMP:  Recent Labs  03/19/15 0914 02/24/16 2319  NA 141 140  K 4.1 3.4*  CL 105 104  CO2  25 25  GLUCOSE 99 116*  BUN 16 15  CALCIUM 9.4 9.7  CREATININE 0.96 1.09  GFRNONAA  --  >60  GFRAA  --  >60    LIVER FUNCTION TESTS:  Recent Labs  03/19/15 0914  BILITOT 0.7  AST 28  ALT 30  ALKPHOS 87  PROT 7.2  ALBUMIN 4.5    TUMOR MARKERS: No results for input(s): AFPTM, CEA, CA199, CHROMGRNA in the last 8760 hours.  Assessment and Plan:  Rt pop 15mm aneurysm with tibial and peroneal distal emboli and ischemic RLE with cool ankle and foot.  Agree with thrombolysis plan. Consent obtained from pt and spouse.  Risks and Benefits discussed with  the patient including, but not limited to bleeding, infection, vascular injury, internal bleeding including intracranial hemorrhage or contrast induced renal failure. All of the patient's questions were answered, patient is agreeable to proceed. Consent signed and in chart. .  Thank you for this interesting consult.  I greatly enjoyed meeting [facility] and look forward to participating in their care.  A copy of this report was sent to the requesting provider on this date.  Electronically Signed: Greggory Keen 02/25/2016, 2:12 AM   I spent a total of 20 Minutes    in face to face in clinical consultation, greater than 50% of which was counseling/coordinating care for ischemic RLE

## 2016-02-25 NOTE — Progress Notes (Signed)
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  S/p thrombolysis by IR.  BP 139/88 mmHg  Pulse 65  Temp(Src) 97.7 F (36.5 C) (Oral)  Resp 13  Ht 5\' 11"  (1.803 m)  Wt 77.7 kg (171 lb 4.8 oz)  BMI 23.90 kg/m2  SpO2 97%    eICU Interventions  No eLink interventions.     Intervention Category Major Interventions: Other:  Hooria Gasparini 02/25/2016, 4:26 AM

## 2016-02-25 NOTE — Sedation Documentation (Signed)
Patient denies pain and is resting comfortably.  

## 2016-02-25 NOTE — Progress Notes (Signed)
Patient ID: Mike Wong, male   DOB: 1945/11/23, 70 y.o.   MRN: [id number]    Referring [id number]): Ruta Hinds  Supervising Physician: Jacqulynn Cadet  Chief Complaint: RLE arterial thrombus 2/2 popliteal aneurysm  Subjective: Patient underwent first phase of thrombolysis last night.  Sheath still in place today.  Food feels better today with better color per the patient.  Allergies: Review of patient's allergies indicates no known allergies.  Medications: Prior to Admission medications   Medication Sig Start Date End Date Taking? Authorizing Provider  amLODipine (NORVASC) 10 MG tablet Take 1 tablet (10 mg total) by mouth daily. 03/19/15  Yes [name], MD  atorvastatin (LIPITOR) 10 MG tablet TAKE 1 TABLET BY MOUTH EVERY DAY 08/19/15  Yes [name], MD  HYDROcodone-acetaminophen (NORCO) 5-325 MG tablet Take 1 tablet by mouth every 6 (six) hours as needed for moderate pain. Patient not taking: Reported on 02/24/2016 10/08/15   Daryll Brod, MD    Vital Signs: BP 129/78 mmHg  Pulse 60  Temp(Src) 98.1 F (36.7 C) (Oral)  Resp 12  Ht 5\' 11"  (1.803 m)  Wt 171 lb 4.8 oz (77.7 kg)  BMI 23.90 kg/m2  SpO2 96%  Physical Exam: Heart: regular Lungs: CTAB Ext: palpable left dorsalis pedal pulse, dopplerable right dorsalis pedal pulse.  Right leg and foot is warm and has good color.  Sheath in place with some oozing on the dressing.  Imaging: Ir Angiogram Extremity Right  02/25/2016  INDICATION: Right popliteal artery aneurysm with distal right peroneal thromboemboli and right lower extremity acute ischemia EXAM: IR INFUSION THROMBOL ARTERIAL INITIAL (MS); IR ULTRASOUND GUIDANCE VASC ACCESS LEFT; RIGHT EXTREMITY ARTERIOGRAPHY MEDICATIONS: 1% lidocaine locally. ANESTHESIA/SEDATION: Moderate (conscious) sedation was employed during this procedure. A total of Versed 1.5 mg and Fentanyl 100 mcg was administered intravenously. Moderate Sedation Time: 45 minutes. The  patient's level of consciousness and vital signs were monitored continuously by radiology nursing throughout the procedure under my direct supervision. CONTRAST:  100 ISOVUE-300 IOPAMIDOL (ISOVUE-300) INJECTION 61% FLUOROSCOPY TIME:  Fluoroscopy Time: 14 minutes 0 seconds (43 mGy). COMPLICATIONS: None immediate. PROCEDURE: Informed consent was obtained from the patient following explanation of the procedure, risks, benefits and alternatives. The patient understands, agrees and consents for the procedure. All questions were addressed. A time out was performed prior to the initiation of the procedure. Maximal barrier sterile technique utilized including caps, mask, sterile gowns, sterile gloves, large sterile drape, hand hygiene, and Betadine prep. Under sterile conditions and local anesthesia, ultrasound micropuncture access performed of the patent left common femoral artery. Initially a short 6 French sheath was inserted over guidewire. A SOS catheter was utilized to select the iliac bifurcation. Contrast injection was performed to identify the right iliac vessels. Exchanged length Glidewire was advanced peripherally into the right SFA. Catheter was exchanged for a C2 catheter which eventually advanced to level of the right common femoral artery. This allowed further advancement of the stiff exchange Glidewire into the right lower extremity. Over the stiff Glidewire, a 6 [place] MPA destination sheath was advanced over the bifurcation and positioned in the right common femoral artery. Right lower extremity angiogram performed through the sheath over the guidewire. Right lower extremity angiogram: Diffuse arteriomegaly of the right iliac, femoral, and popliteal vessels. The right common, internal and external iliac vessels are markedly tortuous but patent. The right common femoral, profunda femoral, and superficial femoral arteries are patent. The right popliteal artery is irregular and tortuous with diffuse fusiform  dilatation compatible with  the known right popliteal artery aneurysm. Filling defect within the popliteal aneurysm evident compatible with residual thrombus adherent to the aneurysm wall. Below-knee right popliteal artery is patent. The right anterior tibial artery is patent to just above the ankle. Small emboli suspected in the distal right anterior tibial artery above the ankle. Small collateral vessels noted. The peroneal artery tapers proximally to thrombotic occlusion in the mid calf level. Diffuse thrombotic occlusion noted of the native peroneal artery. Rosen guidewire advanced into the thrombosed right peroneal artery. This allowed exchange for a 135 cm length infusion catheter with a 40 cm infusion length. Contrast injection of the infusion catheter reconfirms position within the thrombosed peroneal artery below the popliteal aneurysm. Images obtained for documentation. Access secured externally. Transcatheter thrombolysis will begin through the infusion catheter with TNK at 0.5 milligrams/hour. IMPRESSION: Right lower extremity catheter angiogram confirms a partially thrombosed right popliteal artery aneurysm with thromboemboli into the right peroneal artery proximally and in the right anterior tibial artery distally. Infusion catheter access obtained into the thrombosed peroneal artery to begin transcatheter thrombolysis at 0.5 mg TNK per hour. Follow-up angiogram will be performed after approximate 12 hours of infusion. Electronically Signed   By: Jerilynn Mages.  Shick M.D.   On: 02/25/2016 09:01   Ct Angio Ao+bifem W/cm &/or Wo/cm  02/25/2016  CLINICAL DATA:  Acute onset of right leg pain and ischemia. Assess for popliteal aneurysm. Initial encounter. EXAM: CT ANGIOGRAPHY OF ABDOMINAL AORTA WITH ILIOFEMORAL RUNOFF TECHNIQUE: Multidetector CT imaging of the abdomen, pelvis and lower extremities was performed using the standard protocol during bolus administration of intravenous contrast. Multiplanar CT image  reconstructions and MIPs were obtained to evaluate the vascular anatomy. CONTRAST:  100 mL of Isovue 370 IV contrast COMPARISON:  None. FINDINGS: Aorta: Scattered calcification is noted along the abdominal aorta and its branches. The abdominal aorta remains fully patent. The celiac trunk, superior mesenteric artery, bilateral renal arteries and inferior mesenteric artery remain patent. Scattered calcification is noted along the proximal left renal artery. Two right-sided renal arteries are seen. The common, external and internal iliac arteries appear patent bilaterally. The common femoral arteries are grossly unremarkable in appearance. The inferior vena cava is grossly unremarkable in appearance. Right Lower Extremity: The right profunda femoris artery remains patent. The superficial femoral artery is patent. A right popliteal artery aneurysm is noted, measuring 2.6 cm in maximal diameter, best seen on sagittal images. The aneurysm sac is partially thrombosed. There appears to be nonocclusive thrombus within the right tibioperoneal trunk, with trace opacification of the branches of the tibioperoneal trunk at the level of the ankle. The patient has variant vascular anatomy, with only two vessels extending to the right ankle. As described above, there is minimal flow about the periphery of the tibioperoneal trunk. Left Lower Extremity: The left profunda femoris artery remains patent. The superficial femoral artery is unremarkable in appearance. The left popliteal artery is unremarkable. Mild calcification is noted along the left tibioperoneal trunk. There is variant vascular anatomy, with only two vessels extending to the left ankle, which both appear patent. Other Findings: Minimal bibasilar atelectasis is noted. A tiny hiatal hernia is seen. A few scattered hypodensities are noted within the liver, measuring up to 1.4 cm in size. The spleen is unremarkable in appearance. The gallbladder is within normal limits. The  pancreas and adrenal glands are unremarkable. Nonspecific perinephric stranding is noted bilaterally. A 2.2 cm cyst is noted at the upper pole of the left kidney. The kidneys are otherwise unremarkable.  There is no evidence of hydronephrosis. No renal or ureteral stones are seen. No free fluid is identified. The small bowel is unremarkable in appearance. The stomach is within normal limits. The appendix is normal in caliber and contains air, without evidence of appendicitis. The colon is unremarkable in appearance. The bladder is mildly distended and grossly remarkable. The prostate is enlarged, measuring 5.7 cm in transverse dimension, with scattered calcification. No inguinal lymphadenopathy is seen. The visualized musculature of the lower extremities is unremarkable in appearance. No acute osseous abnormalities are identified. Review of the MIP images confirms the above findings. IMPRESSION: 1. Right popliteal artery aneurysm, measuring 2.6 cm in maximal diameter, best seen on sagittal images. The aneurysm sac is partially thrombosed. 2. Nonocclusive thrombus within the right tibioperoneal trunk, with trace opacification of the branches of the tibioperoneal trunk at the level of the ankle. The patient has two vessels extending to the right ankle, with only minimal flow noted at the tibioperoneal trunk. The anterior tibial artery remains patent. 3. Scattered calcification along the abdominal aorta and its branches, including at the proximal left renal artery. Mild calcification along the left tibioperoneal trunk. 4. Tiny hiatal hernia noted. 5. Nonspecific hypodensities within the liver, measuring up to 1.4 cm in size. 6. Small left renal cyst noted. 7. Enlarged prostate noted. These results were called by telephone at the time of interpretation on 02/25/2016 at 1:34 am to Dr. Dina Rich, who verbally acknowledged these results. Electronically Signed   By: Garald Balding M.D.   On: 02/25/2016 01:34   Ir US Guide Vasc  Access Left  02/25/2016  INDICATION: Right popliteal artery aneurysm with distal right peroneal thromboemboli and right lower extremity acute ischemia EXAM: IR INFUSION THROMBOL ARTERIAL INITIAL (MS); IR ULTRASOUND GUIDANCE VASC ACCESS LEFT; RIGHT EXTREMITY ARTERIOGRAPHY MEDICATIONS: 1% lidocaine locally. ANESTHESIA/SEDATION: Moderate (conscious) sedation was employed during this procedure. A total of Versed 1.5 mg and Fentanyl 100 mcg was administered intravenously. Moderate Sedation Time: 45 minutes. The patient's level of consciousness and vital signs were monitored continuously by radiology nursing throughout the procedure under my direct supervision. CONTRAST:  100 ISOVUE-300 IOPAMIDOL (ISOVUE-300) INJECTION 61% FLUOROSCOPY TIME:  Fluoroscopy Time: 14 minutes 0 seconds (43 mGy). COMPLICATIONS: None immediate. PROCEDURE: Informed consent was obtained from the patient following explanation of the procedure, risks, benefits and alternatives. The patient understands, agrees and consents for the procedure. All questions were addressed. A time out was performed prior to the initiation of the procedure. Maximal barrier sterile technique utilized including caps, mask, sterile gowns, sterile gloves, large sterile drape, hand hygiene, and Betadine prep. Under sterile conditions and local anesthesia, ultrasound micropuncture access performed of the patent left common femoral artery. Initially a short 6 French sheath was inserted over guidewire. A SOS catheter was utilized to select the iliac bifurcation. Contrast injection was performed to identify the right iliac vessels. Exchanged length Glidewire was advanced peripherally into the right SFA. Catheter was exchanged for a C2 catheter which eventually advanced to level of the right common femoral artery. This allowed further advancement of the stiff exchange Glidewire into the right lower extremity. Over the stiff Glidewire, a 6 [place] MPA destination sheath was advanced  over the bifurcation and positioned in the right common femoral artery. Right lower extremity angiogram performed through the sheath over the guidewire. Right lower extremity angiogram: Diffuse arteriomegaly of the right iliac, femoral, and popliteal vessels. The right common, internal and external iliac vessels are markedly tortuous but patent. The right common femoral, profunda  femoral, and superficial femoral arteries are patent. The right popliteal artery is irregular and tortuous with diffuse fusiform dilatation compatible with the known right popliteal artery aneurysm. Filling defect within the popliteal aneurysm evident compatible with residual thrombus adherent to the aneurysm wall. Below-knee right popliteal artery is patent. The right anterior tibial artery is patent to just above the ankle. Small emboli suspected in the distal right anterior tibial artery above the ankle. Small collateral vessels noted. The peroneal artery tapers proximally to thrombotic occlusion in the mid calf level. Diffuse thrombotic occlusion noted of the native peroneal artery. Rosen guidewire advanced into the thrombosed right peroneal artery. This allowed exchange for a 135 cm length infusion catheter with a 40 cm infusion length. Contrast injection of the infusion catheter reconfirms position within the thrombosed peroneal artery below the popliteal aneurysm. Images obtained for documentation. Access secured externally. Transcatheter thrombolysis will begin through the infusion catheter with TNK at 0.5 milligrams/hour. IMPRESSION: Right lower extremity catheter angiogram confirms a partially thrombosed right popliteal artery aneurysm with thromboemboli into the right peroneal artery proximally and in the right anterior tibial artery distally. Infusion catheter access obtained into the thrombosed peroneal artery to begin transcatheter thrombolysis at 0.5 mg TNK per hour. Follow-up angiogram will be performed after approximate 12  hours of infusion. Electronically Signed   By: Jerilynn Mages.  Shick M.D.   On: 02/25/2016 09:01   Ir Infusion Thrombol Arterial Initial (ms)  02/25/2016  INDICATION: Right popliteal artery aneurysm with distal right peroneal thromboemboli and right lower extremity acute ischemia EXAM: IR INFUSION THROMBOL ARTERIAL INITIAL (MS); IR ULTRASOUND GUIDANCE VASC ACCESS LEFT; RIGHT EXTREMITY ARTERIOGRAPHY MEDICATIONS: 1% lidocaine locally. ANESTHESIA/SEDATION: Moderate (conscious) sedation was employed during this procedure. A total of Versed 1.5 mg and Fentanyl 100 mcg was administered intravenously. Moderate Sedation Time: 45 minutes. The patient's level of consciousness and vital signs were monitored continuously by radiology nursing throughout the procedure under my direct supervision. CONTRAST:  100 ISOVUE-300 IOPAMIDOL (ISOVUE-300) INJECTION 61% FLUOROSCOPY TIME:  Fluoroscopy Time: 14 minutes 0 seconds (43 mGy). COMPLICATIONS: None immediate. PROCEDURE: Informed consent was obtained from the patient following explanation of the procedure, risks, benefits and alternatives. The patient understands, agrees and consents for the procedure. All questions were addressed. A time out was performed prior to the initiation of the procedure. Maximal barrier sterile technique utilized including caps, mask, sterile gowns, sterile gloves, large sterile drape, hand hygiene, and Betadine prep. Under sterile conditions and local anesthesia, ultrasound micropuncture access performed of the patent left common femoral artery. Initially a short 6 French sheath was inserted over guidewire. A SOS catheter was utilized to select the iliac bifurcation. Contrast injection was performed to identify the right iliac vessels. Exchanged length Glidewire was advanced peripherally into the right SFA. Catheter was exchanged for a C2 catheter which eventually advanced to level of the right common femoral artery. This allowed further advancement of the stiff  exchange Glidewire into the right lower extremity. Over the stiff Glidewire, a 6 [place] MPA destination sheath was advanced over the bifurcation and positioned in the right common femoral artery. Right lower extremity angiogram performed through the sheath over the guidewire. Right lower extremity angiogram: Diffuse arteriomegaly of the right iliac, femoral, and popliteal vessels. The right common, internal and external iliac vessels are markedly tortuous but patent. The right common femoral, profunda femoral, and superficial femoral arteries are patent. The right popliteal artery is irregular and tortuous with diffuse fusiform dilatation compatible with the known right popliteal artery aneurysm. Filling  defect within the popliteal aneurysm evident compatible with residual thrombus adherent to the aneurysm wall. Below-knee right popliteal artery is patent. The right anterior tibial artery is patent to just above the ankle. Small emboli suspected in the distal right anterior tibial artery above the ankle. Small collateral vessels noted. The peroneal artery tapers proximally to thrombotic occlusion in the mid calf level. Diffuse thrombotic occlusion noted of the native peroneal artery. Rosen guidewire advanced into the thrombosed right peroneal artery. This allowed exchange for a 135 cm length infusion catheter with a 40 cm infusion length. Contrast injection of the infusion catheter reconfirms position within the thrombosed peroneal artery below the popliteal aneurysm. Images obtained for documentation. Access secured externally. Transcatheter thrombolysis will begin through the infusion catheter with TNK at 0.5 milligrams/hour. IMPRESSION: Right lower extremity catheter angiogram confirms a partially thrombosed right popliteal artery aneurysm with thromboemboli into the right peroneal artery proximally and in the right anterior tibial artery distally. Infusion catheter access obtained into the thrombosed peroneal  artery to begin transcatheter thrombolysis at 0.5 mg TNK per hour. Follow-up angiogram will be performed after approximate 12 hours of infusion. Electronically Signed   By: Jerilynn Mages.  Shick M.D.   On: 02/25/2016 09:01    Labs:  CBC:  Recent Labs  03/19/15 0914 02/24/16 2319 02/25/16 0510 02/25/16 0930  WBC 6.1 8.7 10.5 8.9  HGB 14.3 14.3 13.6 13.8  HCT 42.4 41.9 38.6* 39.1  PLT 276 242 236 222    COAGS:  Recent Labs  02/24/16 2319  INR 1.01  APTT 29    BMP:  Recent Labs  03/19/15 0914 02/24/16 2319 02/25/16 0510  NA 141 140 141  K 4.1 3.4* 3.4*  CL 105 104 103  CO2 25 25 26   GLUCOSE 99 116* 123*  BUN 16 15 10   CALCIUM 9.4 9.7 9.0  CREATININE 0.96 1.09 1.04  GFRNONAA  --  >60 >60  GFRAA  --  >60 >60    LIVER FUNCTION TESTS:  Recent Labs  03/19/15 0914 02/25/16 0510  BILITOT 0.7 0.7  AST 28 33  ALT 30 26  ALKPHOS 87 75  PROT 7.2 6.8  ALBUMIN 4.5 3.9    Assessment and Plan: 1. S/p RLE angio with thrombolysis last night, Dr. Annamaria Boots -will proceed with secondary part of the procedure today and removal of the sheath -his hgb is stable, minimal drop, likely dilutional -Risks and Benefits discussed with the patient including, but not limited to bleeding, infection, vascular injury or contrast induced renal failure. All of the patient's questions were answered, patient is agreeable to proceed. Consent signed and in chart.   Electronically Signed: Henreitta Cea 02/25/2016, 11:33 AM   I spent a total of 15 Minutes at the the patient's bedside AND on the patient's hospital floor or unit, greater than 50% of which was counseling/coordinating care for right LE DVT

## 2016-02-25 NOTE — Progress Notes (Signed)
Saverio Danker notified of increased bleeding from sheath site; side is soft with no hematoma.  Pressure held for 89min and pressure dressing applied; VSS will continue to monitor.

## 2016-02-25 NOTE — Progress Notes (Signed)
Bleeding noted on pressure dressing; had changed dressing 41min prior; Notified Saverio Danker with no new orders; will continue to hold pressure and apply pressure dressing.  Pressure held for 22min

## 2016-02-25 NOTE — Progress Notes (Signed)
[facility]  Subjective  - foot feels better   Objective 131/86 58 97.7 F (36.5 C) (Oral) 15 94%  Intake/Output Summary (Last 24 hours) at 02/25/16 0738 Last data filed at 02/25/16 0700  Gross per 24 hour  Intake 314.88 ml  Output    700 ml  Net -385.12 ml   Left groin no hematoma Right foot pink warm biphasic PT DP doppler flow  Assessment/Planning: Thrombolysis working at this point Follow up film later today Will need interval repair of popliteal aneurysm  Ruta Hinds 02/25/2016 7:38 AM --  Laboratory Lab Results:  Recent Labs  02/24/16 2319 02/25/16 0510  WBC 8.7 10.5  HGB 14.3 13.6  HCT 41.9 38.6*  PLT 242 236   BMET  Recent Labs  02/24/16 2319 02/25/16 0510  NA 140 141  K 3.4* 3.4*  CL 104 103  CO2 25 26  GLUCOSE 116* 123*  BUN 15 10  CREATININE 1.09 1.04  CALCIUM 9.7 9.0    COAG Lab Results  Component Value Date   INR 1.01 02/24/2016   No results found for: PTT

## 2016-02-25 NOTE — Procedures (Signed)
Interventional Radiology Procedure Note  Procedure:  1.) Right leg lysis check shows persistent thrombus in popliteal aneurysm and occlusion of the peroneal artery with poor flow to the foot.  2.) advanced lysis catheter deeper into peroneal artery and resumed lysis   Complications: None  Estimated Blood Loss: 0  Recommendations: - Continue lysis overnight - Recheck in am  Signed,  Criselda Peaches, MD

## 2016-02-26 ENCOUNTER — Inpatient Hospital Stay (HOSPITAL_COMMUNITY): Payer: Medicare Other

## 2016-02-26 LAB — CBC
HEMATOCRIT: 39.5 % (ref 39.0–52.0)
HEMOGLOBIN: 13.4 g/dL (ref 13.0–17.0)
MCH: 30.5 pg (ref 26.0–34.0)
MCHC: 33.9 g/dL (ref 30.0–36.0)
MCV: 89.8 fL (ref 78.0–100.0)
Platelets: 187 10*3/uL (ref 150–400)
RBC: 4.4 MIL/uL (ref 4.22–5.81)
RDW: 12.8 % (ref 11.5–15.5)
WBC: 10.1 10*3/uL (ref 4.0–10.5)

## 2016-02-26 LAB — FIBRINOGEN: FIBRINOGEN: 358 mg/dL (ref 204–475)

## 2016-02-26 LAB — APTT: aPTT: 40 seconds — ABNORMAL HIGH (ref 24–37)

## 2016-02-26 LAB — HEPARIN LEVEL (UNFRACTIONATED)
HEPARIN UNFRACTIONATED: 0.16 [IU]/mL — AB (ref 0.30–0.70)
Heparin Unfractionated: <0.1 IU/mL — ABNORMAL LOW (ref 0.30–0.70)

## 2016-02-26 MED ORDER — FENTANYL CITRATE (PF) 100 MCG/2ML IJ SOLN
INTRAMUSCULAR | Status: AC
  Filled 2016-02-26: qty 2

## 2016-02-26 MED ORDER — IOPAMIDOL (ISOVUE-300) INJECTION 61%
INTRAVENOUS | Status: AC
  Administered 2016-02-26: 75 mL
  Filled 2016-02-26: qty 100

## 2016-02-26 MED ORDER — MIDAZOLAM HCL 2 MG/2ML IJ SOLN
INTRAMUSCULAR | Status: AC
  Filled 2016-02-26: qty 2

## 2016-02-26 MED ORDER — IOPAMIDOL (ISOVUE-300) INJECTION 61%
INTRAVENOUS | Status: AC
  Administered 2016-02-26: 100 mL
  Filled 2016-02-26: qty 100

## 2016-02-26 MED ORDER — IOPAMIDOL (ISOVUE-300) INJECTION 61%
INTRAVENOUS | Status: AC
  Administered 2016-02-26: 40 mL
  Filled 2016-02-26: qty 50

## 2016-02-26 NOTE — Progress Notes (Addendum)
  Progress Note    02/26/2016 7:53 AM   Subjective:  Tired of laying in the bed.  "I have been laying in the bed for 52 hours"  Afebrile HR 60's-90's NSR [id number] sysstolic [id number] RA  Filed Vitals:   02/26/16 0600 02/26/16 0700  BP: 130/79 142/91  Pulse: 60 59  Temp:    Resp: 15 15    Physical Exam: Cardiac:  regular Lungs:  Non labored Incisions:  Bleeding around left groin cath site Extremities:  +doppler signal right DP/PT & left PT; ?water hammer doppler signal left DP   CBC    Component Value Date/Time   WBC 10.1 02/26/2016 0410   RBC 4.40 02/26/2016 0410   HGB 13.4 02/26/2016 0410   HCT 39.5 02/26/2016 0410   PLT 187 02/26/2016 0410   MCV 89.8 02/26/2016 0410   MCH 30.5 02/26/2016 0410   MCHC 33.9 02/26/2016 0410   RDW 12.8 02/26/2016 0410   LYMPHSABS 1.7 02/24/2016 2319   MONOABS 0.4 02/24/2016 2319   EOSABS 0.1 02/24/2016 2319   BASOSABS 0.0 02/24/2016 2319    BMET    Component Value Date/Time   NA 141 02/25/2016 0510   K 3.4* 02/25/2016 0510   CL 103 02/25/2016 0510   CO2 26 02/25/2016 0510   GLUCOSE 123* 02/25/2016 0510   BUN 10 02/25/2016 0510   CREATININE 1.04 02/25/2016 0510   CREATININE 0.96 03/19/2015 0914   CALCIUM 9.0 02/25/2016 0510   GFRNONAA >60 02/25/2016 0510   GFRAA >60 02/25/2016 0510    INR    Component Value Date/Time   INR 1.01 02/24/2016 2319     Intake/Output Summary (Last 24 hours) at 02/26/16 0753 Last data filed at 02/26/16 0600  Gross per 24 hour  Intake   4256 ml  Output   3675 ml  Net    581 ml     Assessment:  70 y.o. male is s/p:  With acute ischemia right leg undergoing thrombolysis   Plan: -pt with doppler signals in bilateral DP/PT; sensory/motor in tact bilaterally -IR right leg lysis check last evening revealed persistent thrombus in popliteal aneurysm and occlusion of the peroneal artery with poor flow to the foot.  His catheter was advanced deeper in the peroneal artery and lysis  resumed. -scheduled for IR this morning -he has bled at the catheter site multiple times and pressure held.  Hgb is stable from past few days.  -DVT prophylaxis:  Heparin gtt -abnormal pulse left DP-will d/w Dr. Oneida Alar -will need interval repair of of popliteal aneurysm    Leontine Locket, PA-C [facility] [phone] 02/26/2016 7:53 AM    Exam and details as above.  Has good doppler flow to right foot now.  Possibly weak DP pulse on right.  2+ PT 1+ DP on left  Sheath out today Will restart heparin tomorrow Will need interval bypass to repair popliteal aneurysm. Will get right leg vein mapping  Ruta Hinds, MD [facility] [place] Office: [phone] Pager: [phone]

## 2016-02-26 NOTE — Progress Notes (Signed)
Patient ID: Mike Wong, male   DOB: [birth year], 70 y.o.   MRN: [id number] Addendum to previous procedure note  CORRECTION:  Post lysis angio(2 days)  AT is patent to the ankle as the original angio, DP NOT SEEN.  Peroneal is reconstituted with small residual emboli distally.  Peroneal and small collaterals recon the PT at the ankle into the foot

## 2016-02-26 NOTE — Progress Notes (Signed)
ANTICOAGULATION CONSULT NOTE - Initial Consult  Pharmacy Consult for Heparin Indication: thrombosis  No Known Allergies  Patient Measurements: Height: 5\' 11"  (180.3 cm) Weight: 171 lb 4.8 oz (77.7 kg) IBW/kg (Calculated) : 75.3 Heparin Dosing Weight: 77.7 kg  Vital Signs: Temp: 97.8 F (36.6 C) (04/12 1941) Temp Source: Oral (04/12 1941) BP: 134/101 mmHg (04/12 1900) Pulse Rate: 105 (04/12 1900)  Labs:  Recent Labs  02/24/16 2319 02/25/16 0510  02/25/16 1547 02/25/16 2157 02/26/16 0410  HGB 14.3 13.6  < > 13.4 13.5 13.4  HCT 41.9 38.6*  < > 39.6 39.6 39.5  PLT 242 236  < > 217 209 187  APTT 29  --   --   --   --   --   LABPROT 13.5  --   --   --   --   --   INR 1.01  --   --   --   --   --   HEPARINUNFRC  --   --   --   --   --  0.16*  CREATININE 1.09 1.04  --   --   --   --   < > = values in this interval not displayed.  Estimated Creatinine Clearance: 72.4 mL/min (by C-G formula based on Cr of 1.04).   Medical History: Past Medical History  Diagnosis Date  . Finger fracture, right     5th finger  . High cholesterol   . Hypertension   . 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 amLODipine  10 mg Oral Daily  . atorvastatin  10 mg Oral Daily  . docusate sodium  100 mg Oral BID  . pantoprazole  40 mg Oral Daily  . sodium chloride flush  3 mL Intravenous Q12H   Infusions:  . dextrose 5 % and 0.45% NaCl 100 mL/hr at 02/26/16 1509  . heparin 800 Units/hr (02/26/16 1731)    Assessment: 70yo male with acute ischemia in R leg here for thrombolysis. Pharmacy is consulted to dose heparin for thrombosis. Pt was started on heparin 800 units/hr yesterday morning and HL this morning was subtherapeutic. TNKase infusion was stopped at 1120. Will shoot for goal heparin level of 0.3-0.5 for the first 24h following completion of TNKase infusion then 0.3-0.7. APTT is 40, HL is undetectable on heparin 800 units/hr.  Goal of  Therapy:  Heparin level 0.3-0.7 units/ml Monitor platelets by anticoagulation protocol: Yes   Plan:  Increase heparin to 1100 units/hr Check anti-Xa level in 6 hours and daily while on heparin Continue to monitor H&H and platelets  Andrey Cota. Diona Foley, PharmD, BCPS Clinical Pharmacist Pager [phone] 02/26/2016,7:49 PM

## 2016-02-26 NOTE — Sedation Documentation (Signed)
Patient is resting comfortably. No complaints of pain at this time. 

## 2016-02-26 NOTE — Sedation Documentation (Signed)
Patient is resting comfortably. 

## 2016-02-26 NOTE — Sedation Documentation (Signed)
No sedation given.

## 2016-02-26 NOTE — Progress Notes (Signed)
Interventional Radiology 02/26/16 Left 6 french sheath removed from groin at 1415. Manual pressure and VPAD applied to achieve hemostasis at 1430.  Distal pulses intact.  Pressure dressing applied to groin site and area reviewed with RN Elmyra Ricks.  No complications.  [facility]

## 2016-02-26 NOTE — Procedures (Signed)
F/u angio after lysis  AT to DP patent into foot  Peroneal patent to midcalf with small residual clot and surrounding collaterals  Plan to pull sheath in 2 hours.  D/c TNK and hold heparin unitl sheath out

## 2016-02-27 ENCOUNTER — Inpatient Hospital Stay (HOSPITAL_COMMUNITY): Payer: Medicare Other

## 2016-02-27 ENCOUNTER — Other Ambulatory Visit: Payer: Self-pay

## 2016-02-27 DIAGNOSIS — I724 Aneurysm of artery of lower extremity: Secondary | ICD-10-CM

## 2016-02-27 LAB — CBC
HEMATOCRIT: 35.1 % — AB (ref 39.0–52.0)
Hemoglobin: 12.3 g/dL — ABNORMAL LOW (ref 13.0–17.0)
MCH: 31.5 pg (ref 26.0–34.0)
MCHC: 35 g/dL (ref 30.0–36.0)
MCV: 90 fL (ref 78.0–100.0)
PLATELETS: 171 10*3/uL (ref 150–400)
RBC: 3.9 MIL/uL — ABNORMAL LOW (ref 4.22–5.81)
RDW: 12.8 % (ref 11.5–15.5)
WBC: 7.5 10*3/uL (ref 4.0–10.5)

## 2016-02-27 LAB — HEPARIN LEVEL (UNFRACTIONATED): Heparin Unfractionated: 0.27 IU/mL — ABNORMAL LOW (ref 0.30–0.70)

## 2016-02-27 MED ORDER — HYDROCODONE-ACETAMINOPHEN 5-325 MG PO TABS: 1.0000 | ORAL_TABLET | Freq: Four times a day (QID) | ORAL | Status: DC | PRN

## 2016-02-27 MED ORDER — ENOXAPARIN SODIUM 80 MG/0.8ML ~~LOC~~ SOLN
1.0000 mg/kg | Freq: Two times a day (BID) | SUBCUTANEOUS | Status: DC
  Administered 2016-02-27: 80 mg via SUBCUTANEOUS
  Filled 2016-02-27: qty 0.8

## 2016-02-27 MED ORDER — ENOXAPARIN SODIUM 150 MG/ML ~~LOC~~ SOLN: 1.0000 mg/kg | Freq: Two times a day (BID) | SUBCUTANEOUS | Status: DC

## 2016-02-27 MED ORDER — ENOXAPARIN (LOVENOX) PATIENT EDUCATION KIT
PACK | Freq: Once | Status: DC
  Filled 2016-02-27: qty 1

## 2016-02-27 MED FILL — HYDROCODON-APAP 5-325: 5-325 | 8 days supply | Qty: 30 | Fill #0

## 2016-02-27 MED FILL — ENOXAPARIN 150 MG/ML SYRN: 150 | 4 days supply | Qty: 8 | Fill #0

## 2016-02-27 NOTE — Discharge Summary (Signed)
Discharge Summary    Mike Wong Nov 22, 1945 70 y.o. male  [id number]  Admission Date: 02/24/2016  Discharge Date: 02/27/16  Physician: Elam Dutch, MD  Admission Diagnosis: Leg pain [M79.606] Popliteal artery aneurysm (HCC) [I72.4] Lower limb ischemia [I99.8]   HPI:   This is a 70 y.o. male with 2 hour history of coolness right foot. Denies loss of sensation or motor function. No prior episodes. No history of claudication. No family history of aneurysm. This was sudden onset. Other medical problems include hypertension and elevated cholesterol. Denies history of MI or stroke. No history of irregular heartbeat. He has a remote history of smoking quit 20-30 yrs ago.  Hospital Course:  The patient was admitted to the hospital and taken to interventional radiology on 02/25/2016 and underwent: Successful RLE angio with infusion cath access into the thrombosed right peroneal artery.    By POD 1, his foot was feeling much better.  That afternoon, he was taken back to IR where right leg lysis check shows persistent thrombus in popliteal aneurysm and occlusion of the peroneal artery with poor flow to the foot.  The lysis catheter was advanced deeper into the peroneal artery and lysis resumed.  By POD 3, he had 2+ left PT and left groin was without hematoma.  He did get vein mapping with the following results: Right Great Saphenous Vein   Segment Diameter Comment  1. Origin 4.56 mm   2. High Thigh 3.15 mm   3. Mid Thigh 3.24 mm   4. Low Thigh 3.24 mm   5. At Knee 3.15 mm   6. High Calf 2.51 mm Wall thickening  7. Low Calf 1.51 mm Wall thickening  8. Ankle 2.05 mm        Insurance is approved for Lovenox and therefore the pt will be discharged on 02/27/16.  I spoke with pharmacy, and they will discontinue heparin for an hour before giving a dose of Lovenox.  Pharmacist will speak with the pt about convienence of dosing and time.  I  discussed with pharmacy that pt shouldn't go for 16 hours without receiving a dose of Lovenox for timing convenience.   Pharmacy is discussing with pt the best plan for timing of Lovenox.  His last dose of Lovenox will be Monday morning 03/02/16 as he is scheduled for surgery on Tuesday.  Our office will call him with details.  He knows to be npo after MN on Monday.  The remainder of the hospital course consisted of increasing mobilization and increasing intake of solids without difficulty.  CBC    Component Value Date/Time   WBC 7.5 02/27/2016 0548   RBC 3.90* 02/27/2016 0548   HGB 12.3* 02/27/2016 0548   HCT 35.1* 02/27/2016 0548   PLT 171 02/27/2016 0548   MCV 90.0 02/27/2016 0548   MCH 31.5 02/27/2016 0548   MCHC 35.0 02/27/2016 0548   RDW 12.8 02/27/2016 0548   LYMPHSABS 1.7 02/24/2016 2319   MONOABS 0.4 02/24/2016 2319   EOSABS 0.1 02/24/2016 2319   BASOSABS 0.0 02/24/2016 2319    BMET    Component Value Date/Time   NA 141 02/25/2016 0510   K 3.4* 02/25/2016 0510   CL 103 02/25/2016 0510   CO2 26 02/25/2016 0510   GLUCOSE 123* 02/25/2016 0510   BUN 10 02/25/2016 0510   CREATININE 1.04 02/25/2016 0510   CREATININE 0.96 03/19/2015 0914   CALCIUM 9.0 02/25/2016 0510   GFRNONAA >60 02/25/2016 0510   GFRAA >60 02/25/2016 0510  Discharge Instructions    Call MD for:  redness, tenderness, or signs of infection (pain, swelling, bleeding, redness, odor or green/yellow discharge around incision site)    Complete by:  As directed      Call MD for:  severe or increased pain, loss or decreased feeling  in affected limb(s)    Complete by:  As directed      Call MD for:  temperature >100.5    Complete by:  As directed      Discharge instructions    Complete by:  As directed   Your last dose of Lovenox will be Monday morning's dose (03/02/16).     Discharge wound care:    Complete by:  As directed   Shower daily with soap and water starting 02/28/16     Lifting  restrictions    Complete by:  As directed   No heavy lifting for 2 weeks     Resume previous diet    Complete by:  As directed            Discharge Diagnosis:  Leg pain [M79.606] Popliteal artery aneurysm (HCC) [I72.4] Lower limb ischemia [I99.8]  Secondary Diagnosis: Patient Active Problem List   Diagnosis Date Noted  . Popliteal artery aneurysm ([place]) 02/25/2016  . History of colonic polyps 03/13/2014  . Hyperlipidemia with target LDL less than 130 03/13/2014  . History of vitamin D deficiency 03/13/2014  . BPH (benign prostatic hyperplasia) 03/13/2014  . History of renal stone 03/13/2014  . Hypertension 10/20/2012  . Dermatitis 10/20/2012   Past Medical History  Diagnosis Date  . Finger fracture, right     5th finger  . High cholesterol   . Hypertension   . Aneurysm artery, popliteal ([place]) 02/2016    RT LEG        Medication List    TAKE these medications        amLODipine 10 MG tablet  Commonly known as:  NORVASC  Take 1 tablet (10 mg total) by mouth daily.     atorvastatin 10 MG tablet  Commonly known as:  LIPITOR  TAKE 1 TABLET BY MOUTH EVERY DAY     enoxaparin 150 MG/ML injection  Commonly known as:  LOVENOX  Inject 0.52 mLs (80 mg total) into the skin every 12 (twelve) hours.     HYDROcodone-acetaminophen 5-325 MG tablet  Commonly known as:  NORCO  Take 1 tablet by mouth every 6 (six) hours as needed for moderate pain.        Prescriptions given: Vicodin#30 No Refill Lovenox 80mg  SQ bid #8 syringes   Instructions: 1.  Npo after MN Monday night 2.  Last dose of Lovenox is Monday morning 03/02/16  Disposition: home after Lovenox teaching  Patient's condition: is Good  Follow up: 1.  Surgery scheduled for Tuesday, March 03, 2016.   Leontine Locket, PA-C [facility] [phone] 02/27/2016  1:02 PM

## 2016-02-27 NOTE — Progress Notes (Signed)
Pt discharged to home with wife. Pt given discharge instructions, pt and wife voiced understanding. Pt taught how to give lovenox to himself. Pt demonstrated giving lovenox injection to himself.

## 2016-02-27 NOTE — Progress Notes (Signed)
[facility] [place]  Subjective  - foot feels ok   Objective 89/54 53 98.6 F (37 C) (Oral) 11 99%  Intake/Output Summary (Last 24 hours) at 02/27/16 0815 Last data filed at 02/27/16 0700  Gross per 24 hour  Intake 1862.37 ml  Output   1975 ml  Net -112.63 ml   Right foot pink warm doppler signals Left foot 2+ PT, left groin without hematoma  Assessment/Planning: S/p lysis of popliteal aneurysm Interval operation planned for next Tuesday Will d/c home today on therapeutic lovenox if approved otherwise transfer to 2w and keep on heparin until Tuesday Will get right leg vein map today before d/c  Ruta Hinds 02/27/2016 8:15 AM --  Laboratory Lab Results:  Recent Labs  02/26/16 0410 02/27/16 0548  WBC 10.1 7.5  HGB 13.4 12.3*  HCT 39.5 35.1*  PLT 187 171   BMET  Recent Labs  02/24/16 2319 02/25/16 0510  NA 140 141  K 3.4* 3.4*  CL 104 103  CO2 25 26  GLUCOSE 116* 123*  BUN 15 10  CREATININE 1.09 1.04  CALCIUM 9.7 9.0    COAG Lab Results  Component Value Date   INR 1.01 02/24/2016   No results found for: PTT

## 2016-02-27 NOTE — Progress Notes (Signed)
ANTICOAGULATION CONSULT NOTE - Follow Up Consult  Pharmacy Consult for heparin Indication: s/p thrombolysis  Labs:  Recent Labs  02/24/16 2319 02/25/16 0510  02/25/16 2157 02/26/16 0410 02/26/16 2132 02/27/16 0548  HGB 14.3 13.6  < > 13.5 13.4  --  12.3*  HCT 41.9 38.6*  < > 39.6 39.5  --  35.1*  PLT 242 236  < > 209 187  --  171  APTT 29  --   --   --   --  40*  --   LABPROT 13.5  --   --   --   --   --   --   INR 1.01  --   --   --   --   --   --   HEPARINUNFRC  --   --   --   --  0.16* <0.10* 0.27*  CREATININE 1.09 1.04  --   --   --   --   --   < > = values in this interval not displayed.   Assessment: 70yo male subtherapeutic on heparin after rate increase though close to goal.  Goal of Therapy:  Heparin level 0.3-0.5 units/ml until 1130 then 0.3-0.7   Plan:  Will increase heparin gtt slightly to 1200 units/hr and check level in 6hr.  Wynona Neat, PharmD, BCPS  02/27/2016,7:02 AM

## 2016-02-27 NOTE — Progress Notes (Signed)
[place] for Heparin>>lovenox Indication: thrombosis  No Known Allergies  Patient Measurements: Height: 5\' 11"  (180.3 cm) Weight: 171 lb 4.8 oz (77.7 kg) IBW/kg (Calculated) : 75.3 Heparin Dosing Weight: 77.7 kg  Vital Signs: Temp: 98.5 F (36.9 C) (04/13 0700) Temp Source: Oral (04/13 0402) BP: 110/62 mmHg (04/13 1000) Pulse Rate: 65 (04/13 1000)  Labs:  Recent Labs  02/24/16 2319 02/25/16 0510  02/25/16 2157 02/26/16 0410 02/26/16 2132 02/27/16 0548  HGB 14.3 13.6  < > 13.5 13.4  --  12.3*  HCT 41.9 38.6*  < > 39.6 39.5  --  35.1*  PLT 242 236  < > 209 187  --  171  APTT 29  --   --   --   --  40*  --   LABPROT 13.5  --   --   --   --   --   --   INR 1.01  --   --   --   --   --   --   HEPARINUNFRC  --   --   --   --  0.16* <0.10* 0.27*  CREATININE 1.09 1.04  --   --   --   --   --   < > = values in this interval not displayed.  Estimated Creatinine Clearance: 72.4 mL/min (by C-G formula based on Cr of 1.04).   Medical History: Past Medical History  Diagnosis Date  . Finger fracture, right     5th finger  . High cholesterol   . Hypertension   . Aneurysm artery, popliteal ([facility]) 02/2016    RT LEG    Assessment: 70yo male with acute ischemia in R leg here for thrombolysis.  TNK completed 4/12.   Transition to sq lovenox this afternoon so patient can be discharged home. Educated patient on use of lovenox and he will be filling his rx at outpatient pharmacy.  Interval operation planned for next Tuesday, patient will be on sq lovenox until that time.   Goal of Therapy:  Anti-Xa level 0.6-1 units/ml 4hrs after LMWH dose given Monitor platelets by anticoagulation protocol: Yes   Plan:  D/c heparin now (done ~1300) Will give first shot in ~1 hour. Instructed patient to take next shot this evening close to midnight and start working his way back to a reasonable schedule  Erin Hearing PharmD., BCPS Clinical  Pharmacist Pager [phone] 02/27/2016 1:27 PM

## 2016-02-27 NOTE — Progress Notes (Signed)
VASCULAR LAB PRELIMINARY  PRELIMINARY  PRELIMINARY  PRELIMINARY  Right Lower Extremity Vein Map    Right Great Saphenous Vein   Segment Diameter Comment  1. Origin 4.56 mm   2. High Thigh 3.15 mm   3. Mid Thigh 3.24 mm   4. Low Thigh 3.24 mm   5. At Knee 3.15 mm   6. High Calf 2.51 mm Wall thickening  7. Low Calf 1.51 mm Wall thickening  8. Ankle 2.05 mm    mm    mm    mm      Vana Arif, RVT 02/27/2016, 10:28 AM

## 2016-02-27 NOTE — Care Management Note (Addendum)
Case Management Note  Patient Details  Name: Mike Wong MRN: [id number] Date of Birth: 01-Apr-1946  Subjective/Objective:    Pt admitted with popliteal artery aneurysm                Action/Plan:  PTA - independent from home with wife.   Expected Discharge Date:                  Expected Discharge Plan:  Home/Self Care  In-House Referral:     Discharge planning Services  CM Consult  Post Acute Care Choice:    Choice offered to:     DME Arranged:    DME Agency:     HH Arranged:    [place] Agency:     Status of Service:  Completed, signed off  Medicare Important Message Given:    Date Medicare IM Given:    Medicare IM give by:    Date Additional Medicare IM Given:    Additional Medicare Important Message give by:     If discussed at [place] of Stay Meetings, dates discussed:    Additional Comments: 02/27/2016  Pt will discharge home on Lovenox - per pharmacy 80mg  BID 5-7 days.   CM submitted benefit check: S/W [facility] @ [employer] # [phone]   LOVENOX 80 MG BID FOR 7 DAYS   NOT ON FORMULARY   ENOXAPARIN 80 MG BID FOR 7 DAYS   COVER- YES  CO-PAY - 50 % OF COAST  TIER- 4 PRIOR APPROVAL -NO  PHARMACY: [place] contacted [facility]; tech ran insurance and determined pts copay will be approximately $74.  CM communicated this to pt and pt stated he could pay the cost without hardship, pt is eager to discharge home.  Pt informed that he will be trained how to administer the medication prior to discharge by bedside nurse.  CM requested bedside nurse to inform attending services that benefit check was completed and that pt was in agreement with cost - medication if faxed should be sent to [place]. CM also provided this information on summary tab under physician sticky note, left voicemail for vascular service.  CM provided pt [facility] address, phone number and  hours of operation - CM communicated with bedside nurse that pharmacy closes at [place], [place], [place] 02/27/2016, 2:33 PM

## 2016-03-02 ENCOUNTER — Encounter (HOSPITAL_COMMUNITY): Payer: Self-pay | Admitting: *Deleted

## 2016-03-02 MED ORDER — DEXTROSE 5 % IV SOLN
1.5000 g | INTRAVENOUS | Status: AC
  Administered 2016-03-03 (×2): 1.5 g via INTRAVENOUS
  Filled 2016-03-02: qty 1.5

## 2016-03-02 MED ORDER — SODIUM CHLORIDE 0.9 % IV SOLN: INTRAVENOUS | Status: DC

## 2016-03-02 NOTE — Progress Notes (Signed)
Pt denies cardiac history, chest pain or sob. Just discharged from hospital on 02/27/16. States his last dose of Lovenox was this AM.   EKG - 03/04/2016 - in EPIC.  Pt had negative MRSA PCR on 02/25/16.

## 2016-03-03 ENCOUNTER — Encounter (HOSPITAL_COMMUNITY): Payer: Self-pay | Admitting: *Deleted

## 2016-03-03 ENCOUNTER — Inpatient Hospital Stay (HOSPITAL_COMMUNITY)
Admission: RE | Admit: 2016-03-03 | Discharge: 2016-03-04 | DRG: 253 | Disposition: A | Discharge status: Home / Self Care | Payer: Medicare Other | Source: Ambulatory Visit | Attending: Vascular Surgery | Admitting: Vascular Surgery

## 2016-03-03 ENCOUNTER — Inpatient Hospital Stay (HOSPITAL_COMMUNITY): Payer: Medicare Other | Admitting: Anesthesiology

## 2016-03-03 ENCOUNTER — Encounter (HOSPITAL_COMMUNITY)
Admission: RE | Disposition: A | Discharge status: Home / Self Care | Payer: Self-pay | Source: Ambulatory Visit | Attending: Vascular Surgery

## 2016-03-03 ENCOUNTER — Telehealth: Payer: Self-pay | Admitting: Vascular Surgery

## 2016-03-03 DIAGNOSIS — Z87891 Personal history of nicotine dependence: Secondary | ICD-10-CM | POA: Diagnosis not present

## 2016-03-03 DIAGNOSIS — M1990 Unspecified osteoarthritis, unspecified site: Secondary | ICD-10-CM | POA: Diagnosis not present

## 2016-03-03 DIAGNOSIS — E7800 Pure hypercholesterolemia, unspecified: Secondary | ICD-10-CM | POA: Diagnosis present

## 2016-03-03 DIAGNOSIS — I724 Aneurysm of artery of lower extremity: Principal | ICD-10-CM | POA: Diagnosis present

## 2016-03-03 DIAGNOSIS — I743 Embolism and thrombosis of arteries of the lower extremities: Secondary | ICD-10-CM | POA: Diagnosis present

## 2016-03-03 DIAGNOSIS — I10 Essential (primary) hypertension: Secondary | ICD-10-CM | POA: Diagnosis present

## 2016-03-03 DIAGNOSIS — I771 Stricture of artery: Secondary | ICD-10-CM | POA: Diagnosis present

## 2016-03-03 HISTORY — DX: Calculus of kidney: N20.0

## 2016-03-03 HISTORY — PX: BYPASS GRAFT POPLITEAL TO POPLITEAL: SHX5763

## 2016-03-03 HISTORY — DX: Unspecified osteoarthritis, unspecified site: M19.90

## 2016-03-03 HISTORY — PX: EMBOLECTOMY: SHX44

## 2016-03-03 HISTORY — PX: FALSE ANEURYSM REPAIR: SHX5152

## 2016-03-03 LAB — CBC
HEMATOCRIT: 35.8 % — AB (ref 39.0–52.0)
Hemoglobin: 11.9 g/dL — ABNORMAL LOW (ref 13.0–17.0)
MCH: 30.1 pg (ref 26.0–34.0)
MCHC: 33.2 g/dL (ref 30.0–36.0)
MCV: 90.4 fL (ref 78.0–100.0)
PLATELETS: 244 10*3/uL (ref 150–400)
RBC: 3.96 MIL/uL — ABNORMAL LOW (ref 4.22–5.81)
RDW: 12.7 % (ref 11.5–15.5)
WBC: 10.4 10*3/uL (ref 4.0–10.5)

## 2016-03-03 LAB — CREATININE, SERUM
Creatinine, Ser: 1.06 mg/dL (ref 0.61–1.24)
GFR calc non Af Amer: >60 mL/min (ref >60)

## 2016-03-03 LAB — PROTIME-INR
INR: 1.13 (ref 0.00–1.49)
Prothrombin Time: 14.7 seconds (ref 11.6–15.2)

## 2016-03-03 LAB — TYPE AND SCREEN
ABO/RH(D): A POS
Antibody Screen: NEGATIVE

## 2016-03-03 LAB — APTT: aPTT: 36 seconds (ref 24–37)

## 2016-03-03 LAB — ABO/RH: ABO/RH(D): A POS

## 2016-03-03 SURGERY — REPAIR, PSEUDOANEURYSM
Anesthesia: General | Laterality: Right

## 2016-03-03 MED ORDER — HYDROCODONE-ACETAMINOPHEN 7.5-325 MG PO TABS: 1.0000 | ORAL_TABLET | Freq: Once | ORAL | Status: DC | PRN

## 2016-03-03 MED ORDER — SUGAMMADEX SODIUM 200 MG/2ML IV SOLN
INTRAVENOUS | Status: AC
  Filled 2016-03-03: qty 2

## 2016-03-03 MED ORDER — HYDROCODONE-ACETAMINOPHEN 5-325 MG PO TABS
1.0000 | ORAL_TABLET | Freq: Four times a day (QID) | ORAL | Status: DC | PRN
  Filled 2016-03-03: qty 1

## 2016-03-03 MED ORDER — PROTAMINE SULFATE 10 MG/ML IV SOLN
INTRAVENOUS | Status: AC
  Filled 2016-03-03: qty 5

## 2016-03-03 MED ORDER — ALBUMIN HUMAN 5 % IV SOLN
INTRAVENOUS | Status: DC | PRN
  Administered 2016-03-03: 13:00:00 via INTRAVENOUS

## 2016-03-03 MED ORDER — HYDRALAZINE HCL 20 MG/ML IJ SOLN: 5.0000 mg | INTRAMUSCULAR | Status: DC | PRN

## 2016-03-03 MED ORDER — ATORVASTATIN CALCIUM 10 MG PO TABS
10.0000 mg | ORAL_TABLET | Freq: Every day | ORAL | Status: DC
  Administered 2016-03-03: 10 mg via ORAL
  Filled 2016-03-03: qty 1

## 2016-03-03 MED ORDER — DEXAMETHASONE SODIUM PHOSPHATE 4 MG/ML IJ SOLN
INTRAMUSCULAR | Status: AC
  Filled 2016-03-03: qty 2

## 2016-03-03 MED ORDER — ONDANSETRON HCL 4 MG/2ML IJ SOLN: 4.0000 mg | Freq: Four times a day (QID) | INTRAMUSCULAR | Status: DC | PRN

## 2016-03-03 MED ORDER — HEPARIN SODIUM (PORCINE) 1000 UNIT/ML IJ SOLN
INTRAMUSCULAR | Status: DC | PRN
  Administered 2016-03-03: 8000 [IU] via INTRAVENOUS

## 2016-03-03 MED ORDER — MIDAZOLAM HCL 5 MG/5ML IJ SOLN
INTRAMUSCULAR | Status: DC | PRN
  Administered 2016-03-03: 2 mg via INTRAVENOUS

## 2016-03-03 MED ORDER — METOPROLOL TARTRATE 1 MG/ML IV SOLN: 2.0000 mg | INTRAVENOUS | Status: DC | PRN

## 2016-03-03 MED ORDER — DEXTROSE 5 % IV SOLN
1.5000 g | INTRAVENOUS | Status: DC
  Filled 2016-03-03: qty 1.5

## 2016-03-03 MED ORDER — AMLODIPINE BESYLATE 10 MG PO TABS
10.0000 mg | ORAL_TABLET | Freq: Every day | ORAL | Status: DC
  Administered 2016-03-03: 10 mg via ORAL
  Filled 2016-03-03 (×2): qty 1

## 2016-03-03 MED ORDER — FENTANYL CITRATE (PF) 250 MCG/5ML IJ SOLN
INTRAMUSCULAR | Status: AC
  Filled 2016-03-03: qty 5

## 2016-03-03 MED ORDER — HYDROCODONE-ACETAMINOPHEN 5-325 MG PO TABS: 1.0000 | ORAL_TABLET | Freq: Four times a day (QID) | ORAL | Status: DC | PRN

## 2016-03-03 MED ORDER — PANTOPRAZOLE SODIUM 40 MG PO TBEC
40.0000 mg | DELAYED_RELEASE_TABLET | Freq: Every day | ORAL | Status: DC
Start: 2016-03-03 — End: 2016-03-04
  Administered 2016-03-03: 40 mg via ORAL
  Filled 2016-03-03 (×2): qty 1

## 2016-03-03 MED ORDER — ALUM & MAG HYDROXIDE-SIMETH 200-200-20 MG/5ML PO SUSP: 15.0000 mL | ORAL | Status: DC | PRN

## 2016-03-03 MED ORDER — LACTATED RINGERS IV SOLN
INTRAVENOUS | Status: DC
  Administered 2016-03-03: 08:00:00 via INTRAVENOUS

## 2016-03-03 MED ORDER — LABETALOL HCL 5 MG/ML IV SOLN: 10.0000 mg | INTRAVENOUS | Status: DC | PRN

## 2016-03-03 MED ORDER — SUGAMMADEX SODIUM 200 MG/2ML IV SOLN
INTRAVENOUS | Status: DC | PRN
  Administered 2016-03-03: 200 mg via INTRAVENOUS

## 2016-03-03 MED ORDER — ACETAMINOPHEN 650 MG RE SUPP: 325.0000 mg | RECTAL | Status: DC | PRN

## 2016-03-03 MED ORDER — HEPARIN SODIUM (PORCINE) 1000 UNIT/ML IJ SOLN: INTRAMUSCULAR | Status: DC | PRN

## 2016-03-03 MED ORDER — LACTATED RINGERS IV SOLN
INTRAVENOUS | Status: DC | PRN
  Administered 2016-03-03 (×3): via INTRAVENOUS

## 2016-03-03 MED ORDER — GUAIFENESIN-DM 100-10 MG/5ML PO SYRP: 15.0000 mL | ORAL_SOLUTION | ORAL | Status: DC | PRN

## 2016-03-03 MED ORDER — IOPAMIDOL (ISOVUE-300) INJECTION 61%
INTRAVENOUS | Status: AC
  Filled 2016-03-03: qty 50

## 2016-03-03 MED ORDER — SODIUM CHLORIDE 0.9 % IV SOLN: 500.0000 mL | Freq: Once | INTRAVENOUS | Status: DC | PRN

## 2016-03-03 MED ORDER — FENTANYL CITRATE (PF) 100 MCG/2ML IJ SOLN
INTRAMUSCULAR | Status: DC | PRN
  Administered 2016-03-03 (×5): 50 ug via INTRAVENOUS

## 2016-03-03 MED ORDER — PROPOFOL 10 MG/ML IV BOLUS
INTRAVENOUS | Status: AC
  Filled 2016-03-03: qty 20

## 2016-03-03 MED ORDER — BISACODYL 10 MG RE SUPP: 10.0000 mg | Freq: Every day | RECTAL | Status: DC | PRN

## 2016-03-03 MED ORDER — ENOXAPARIN SODIUM 30 MG/0.3ML ~~LOC~~ SOLN: 30.0000 mg | SUBCUTANEOUS | Status: DC

## 2016-03-03 MED ORDER — MAGNESIUM HYDROXIDE 400 MG/5ML PO SUSP
30.0000 mL | Freq: Every day | ORAL | Status: DC | PRN
  Filled 2016-03-03: qty 30

## 2016-03-03 MED ORDER — POTASSIUM CHLORIDE CRYS ER 20 MEQ PO TBCR: 20.0000 meq | EXTENDED_RELEASE_TABLET | Freq: Every day | ORAL | Status: DC | PRN

## 2016-03-03 MED ORDER — MIDAZOLAM HCL 2 MG/2ML IJ SOLN
INTRAMUSCULAR | Status: AC
  Filled 2016-03-03: qty 2

## 2016-03-03 MED ORDER — HEPARIN SODIUM (PORCINE) 5000 UNIT/ML IJ SOLN
INTRAMUSCULAR | Status: DC | PRN
  Administered 2016-03-03: 13:00:00

## 2016-03-03 MED ORDER — DEXAMETHASONE SODIUM PHOSPHATE 4 MG/ML IJ SOLN
INTRAMUSCULAR | Status: DC | PRN
  Administered 2016-03-03: 8 mg via INTRAVENOUS

## 2016-03-03 MED ORDER — HEPARIN SODIUM (PORCINE) 1000 UNIT/ML IJ SOLN
INTRAMUSCULAR | Status: AC
  Filled 2016-03-03: qty 1

## 2016-03-03 MED ORDER — ACETAMINOPHEN 325 MG PO TABS: 325.0000 mg | ORAL_TABLET | ORAL | Status: DC | PRN

## 2016-03-03 MED ORDER — DOCUSATE SODIUM 100 MG PO CAPS
100.0000 mg | ORAL_CAPSULE | Freq: Every day | ORAL | Status: DC
  Filled 2016-03-03: qty 1

## 2016-03-03 MED ORDER — FENTANYL CITRATE (PF) 100 MCG/2ML IJ SOLN: 25.0000 ug | INTRAMUSCULAR | Status: DC | PRN

## 2016-03-03 MED ORDER — PHENOL 1.4 % MT LIQD: 1.0000 | OROMUCOSAL | Status: DC | PRN

## 2016-03-03 MED ORDER — 0.9 % SODIUM CHLORIDE (POUR BTL) OPTIME
TOPICAL | Status: DC | PRN
  Administered 2016-03-03: 3000 mL

## 2016-03-03 MED ORDER — ROCURONIUM BROMIDE 100 MG/10ML IV SOLN
INTRAVENOUS | Status: DC | PRN
  Administered 2016-03-03 (×3): 10 mg via INTRAVENOUS
  Administered 2016-03-03: 50 mg via INTRAVENOUS
  Administered 2016-03-03: 10 mg via INTRAVENOUS

## 2016-03-03 MED ORDER — EPHEDRINE SULFATE 50 MG/ML IJ SOLN
INTRAMUSCULAR | Status: DC | PRN
  Administered 2016-03-03 (×2): 5 mg via INTRAVENOUS
  Administered 2016-03-03: 10 mg via INTRAVENOUS

## 2016-03-03 MED ORDER — MORPHINE SULFATE (PF) 2 MG/ML IV SOLN: 2.0000 mg | INTRAVENOUS | Status: DC | PRN

## 2016-03-03 MED ORDER — ONDANSETRON HCL 4 MG/2ML IJ SOLN
INTRAMUSCULAR | Status: DC | PRN
  Administered 2016-03-03: 4 mg via INTRAVENOUS

## 2016-03-03 MED ORDER — PROPOFOL 10 MG/ML IV BOLUS
INTRAVENOUS | Status: DC | PRN
  Administered 2016-03-03: 150 mg via INTRAVENOUS

## 2016-03-03 MED ORDER — CEFUROXIME SODIUM 1.5 G IJ SOLR
1.5000 g | Freq: Two times a day (BID) | INTRAMUSCULAR | Status: AC
  Administered 2016-03-03 – 2016-03-04 (×2): 1.5 g via INTRAVENOUS
  Filled 2016-03-03 (×2): qty 1.5

## 2016-03-03 MED ORDER — ARTIFICIAL TEARS OP OINT
TOPICAL_OINTMENT | OPHTHALMIC | Status: DC | PRN
  Administered 2016-03-03: 1 via OPHTHALMIC

## 2016-03-03 MED ORDER — SODIUM CHLORIDE 0.9 % IV SOLN: INTRAVENOUS | Status: DC

## 2016-03-03 MED ORDER — ONDANSETRON HCL 4 MG/2ML IJ SOLN: 4.0000 mg | Freq: Once | INTRAMUSCULAR | Status: DC | PRN

## 2016-03-03 MED ORDER — PHENYLEPHRINE HCL 10 MG/ML IJ SOLN
INTRAMUSCULAR | Status: DC | PRN
  Administered 2016-03-03: 80 ug via INTRAVENOUS

## 2016-03-03 MED ORDER — CHLORHEXIDINE GLUCONATE CLOTH 2 % EX PADS: 6.0000 | MEDICATED_PAD | Freq: Once | CUTANEOUS | Status: DC

## 2016-03-03 MED ORDER — SODIUM CHLORIDE 0.9 % IV SOLN
10.0000 mg | INTRAVENOUS | Status: DC | PRN
  Administered 2016-03-03: 20 ug/min via INTRAVENOUS

## 2016-03-03 MED ORDER — LIDOCAINE HCL (CARDIAC) 20 MG/ML IV SOLN
INTRAVENOUS | Status: DC | PRN
  Administered 2016-03-03: 100 mg via INTRAVENOUS

## 2016-03-03 SURGICAL SUPPLY — 46 items
CANISTER SUCTION 2500CC (MISCELLANEOUS) ×2 IMPLANT
CANNULA VESSEL 3MM 2 BLNT TIP (CANNULA) ×2 IMPLANT
CATH EMB 3FR 80CM (CATHETERS) ×1 IMPLANT
CLIP TI MEDIUM 24 (CLIP) ×2 IMPLANT
CLIP TI WIDE RED SMALL 24 (CLIP) ×2 IMPLANT
CONT SPEC 4OZ CLIKSEAL STRL BL (MISCELLANEOUS) ×1 IMPLANT
ELECT REM PT RETURN 9FT ADLT (ELECTROSURGICAL) ×2
ELECTRODE REM PT RTRN 9FT ADLT (ELECTROSURGICAL) ×1 IMPLANT
GLOVE BIO SURGEON STRL SZ 6.5 (GLOVE) ×2 IMPLANT
GLOVE BIO SURGEON STRL SZ7.5 (GLOVE) ×2 IMPLANT
GLOVE BIOGEL PI IND STRL 6.5 (GLOVE) IMPLANT
GLOVE BIOGEL PI IND STRL 7.0 (GLOVE) IMPLANT
GLOVE BIOGEL PI IND STRL 7.5 (GLOVE) IMPLANT
GLOVE BIOGEL PI IND STRL 8 (GLOVE) IMPLANT
GLOVE BIOGEL PI INDICATOR 6.5 (GLOVE) ×3
GLOVE BIOGEL PI INDICATOR 7.0 (GLOVE) ×1
GLOVE BIOGEL PI INDICATOR 7.5 (GLOVE) ×1
GLOVE BIOGEL PI INDICATOR 8 (GLOVE) ×1
GLOVE ECLIPSE 6.5 STRL STRAW (GLOVE) ×2 IMPLANT
GLOVE ECLIPSE 7.0 STRL STRAW (GLOVE) ×1 IMPLANT
GOWN STRL REUS W/ TWL LRG LVL3 (GOWN DISPOSABLE) ×3 IMPLANT
GOWN STRL REUS W/TWL LRG LVL3 (GOWN DISPOSABLE) ×12
KIT BASIN OR (CUSTOM PROCEDURE TRAY) ×2 IMPLANT
KIT ROOM TURNOVER OR (KITS) ×2 IMPLANT
LIQUID BAND (GAUZE/BANDAGES/DRESSINGS) ×3 IMPLANT
NS IRRIG 1000ML POUR BTL (IV SOLUTION) ×4 IMPLANT
PACK PERIPHERAL VASCULAR (CUSTOM PROCEDURE TRAY) ×2 IMPLANT
PAD ARMBOARD 7.5X6 YLW CONV (MISCELLANEOUS) ×4 IMPLANT
SUT PROLENE 5 0 C 1 24 (SUTURE) ×2 IMPLANT
SUT PROLENE 6 0 CC (SUTURE) ×6 IMPLANT
SUT SILK 0 TIES 10X30 (SUTURE) ×1 IMPLANT
SUT SILK 3 0 (SUTURE) ×4
SUT SILK 3-0 18XBRD TIE 12 (SUTURE) IMPLANT
SUT SILK 4 0 (SUTURE) ×2
SUT SILK 4-0 18XBRD TIE 12 (SUTURE) IMPLANT
SUT VIC AB 2-0 SH 27 (SUTURE) ×2
SUT VIC AB 2-0 SH 27XBRD (SUTURE) IMPLANT
SUT VIC AB 3-0 SH 27 (SUTURE) ×8
SUT VIC AB 3-0 SH 27X BRD (SUTURE) ×1 IMPLANT
SUT VICRYL 4-0 PS2 18IN ABS (SUTURE) ×5 IMPLANT
SYR 3ML LL SCALE MARK (SYRINGE) ×1 IMPLANT
TAPE STRIPS DRAPE STRL (GAUZE/BANDAGES/DRESSINGS) ×1 IMPLANT
TAPE UMBILICAL COTTON 1/8X30 (MISCELLANEOUS) ×1 IMPLANT
TRAY FOLEY W/METER SILVER 16FR (SET/KITS/TRAYS/PACK) ×2 IMPLANT
UNDERPAD 30X30 INCONTINENT (UNDERPADS AND DIAPERS) ×2 IMPLANT
WATER STERILE IRR 1000ML POUR (IV SOLUTION) ×2 IMPLANT

## 2016-03-03 NOTE — Anesthesia Procedure Notes (Signed)
Procedure Name: Intubation Date/Time: 03/03/2016 10:18 AM Performed by: Jacquiline Doe A Pre-anesthesia Checklist: Patient identified, Timeout performed, Emergency Drugs available, Suction available and Patient being monitored Patient Re-evaluated:Patient Re-evaluated prior to inductionOxygen Delivery Method: Circle system utilized Preoxygenation: Pre-oxygenation with 100% oxygen Intubation Type: IV induction and Cricoid Pressure applied Ventilation: Mask ventilation without difficulty and Oral airway inserted - appropriate to patient size Laryngoscope Size: Mac and 4 Grade View: Grade I Tube type: Oral Tube size: 7.5 mm Number of attempts: 1 Airway Equipment and Method: Stylet Placement Confirmation: ETT inserted through vocal cords under direct vision,  breath sounds checked- equal and bilateral and positive ETCO2 Secured at: 23 cm Tube secured with: Tape Dental Injury: Teeth and Oropharynx as per pre-operative assessment

## 2016-03-03 NOTE — Interval H&P Note (Signed)
History and Physical Interval Note:  03/03/2016 7:39 AM  Mike Wong  has presented today for surgery, with the diagnosis of Right popliteal artery aneurysm I72.4  The various methods of treatment have been discussed with the patient and family. After consideration of risks, benefits and other options for treatment, the patient has consented to  Procedure(s): REPAIR POPLITEAL ARTERY ANEURYSM (Right) as a surgical intervention .  The patient's history has been reviewed, patient examined, no change in status, stable for surgery.  I have reviewed the patient's chart and labs.  Questions were answered to the patient's satisfaction.    Pt s/p thrombolysis.  Right foot pink warm doppler signals for definitive popliteal aneurysm repair today.  Risks benefits complications procedure details discussed with pt and wife.   Ruta Hinds

## 2016-03-03 NOTE — Anesthesia Postprocedure Evaluation (Signed)
Anesthesia Post Note  Patient: Mike Wong  Procedure(s) Performed: Procedure(s) (LRB): LIGATION RIGHT  POPLITEAL ARTERY ANEURYSM (Right) BYPASS RIGHT ABOVE POPLITEAL TO BELOW POPLITEAL USING REVERSED RIGHT GREATER SAPHENOUS VEIN (Right) RIGHT ANTERIOR TIBIAL EMBOLECTOMY (Right)  Patient location during evaluation: PACU Anesthesia Type: General Level of consciousness: awake and alert Pain management: pain level controlled Vital Signs Assessment: post-procedure vital signs reviewed and stable Respiratory status: spontaneous breathing, nonlabored ventilation, respiratory function stable and patient connected to nasal cannula oxygen Cardiovascular status: blood pressure returned to baseline and stable Postop Assessment: no signs of nausea or vomiting Anesthetic complications: no    Last Vitals:  Filed Vitals:   03/03/16 1440 03/03/16 1442  BP: 140/83   Pulse: 97   Temp:  36.7 C  Resp: 11     Last Pain: There were no vitals filed for this visit.        RLE Sensation: No numbness;No tingling;No pain (03/03/16 1442)      Zenaida Deed

## 2016-03-03 NOTE — Telephone Encounter (Signed)
-----   Message from Mena Goes, RN sent at 03/03/2016  2:28 PM EDT ----- Regarding: schedule   ----- Message -----    From: Gabriel Earing, PA-C    Sent: 03/03/2016   2:26 PM      To: Vvs Charge Pool  S/p right above to below knee bypass with vein 03/03/16.  F/u with Dr. Oneida Alar in 2 weeks.  Thanks, Aldona Bar

## 2016-03-03 NOTE — Telephone Encounter (Signed)
sched appt for 5/4 at 11:15. Lm on hm# to inform pt of appt.

## 2016-03-03 NOTE — Progress Notes (Signed)
  Day of Surgery Note    Subjective:  No complaints  Filed Vitals:   03/03/16 1440 03/03/16 1442  BP: 140/83   Pulse: 97   Temp:  98 F (36.7 C)  Resp: 11     Incisions:   All are clean and dry Extremities:  Right brisk doppler signals in PT/DP/peroneal Cardiac:  regular Lungs:  Non labored    Assessment/Plan:  This is a 70 y.o. male who is s/p Right above-knee popliteal to below knee popliteal bypass,ipsilateral reversed greater saphenous vein, tibial embolectomy  -pt doing well in pacu with brisk doppler signals right DP/PT/peroneal; sensory/motor are in tact. -to [facility] when bed available. -most likely discharge in the next couple of days if he does well.   Leontine Locket, PA-C 03/03/2016 2:58 PM

## 2016-03-03 NOTE — Anesthesia Preprocedure Evaluation (Addendum)
Anesthesia Evaluation  Patient identified by MRN, date of birth, ID band Patient awake    Reviewed: Allergy & Precautions, H&P , NPO status , Patient's Chart, lab work & pertinent test results  History of Anesthesia Complications Negative for: history of anesthetic complications  Airway Mallampati: II  TM Distance: >3 FB Neck ROM: full    Dental  (+) Partial Lower, Partial Upper   Pulmonary former smoker,    Pulmonary exam normal breath sounds clear to auscultation       Cardiovascular hypertension, + Peripheral Vascular Disease  Normal cardiovascular exam Rhythm:regular Rate:Normal     Neuro/Psych negative neurological ROS     GI/Hepatic negative GI ROS, Neg liver ROS,   Endo/Other  negative endocrine ROS  Renal/GU negative Renal ROS     Musculoskeletal  (+) Arthritis ,   Abdominal   Peds  Hematology negative hematology ROS (+)   Anesthesia Other Findings   Reproductive/Obstetrics negative OB ROS                            Anesthesia Physical Anesthesia Plan  ASA: II  Anesthesia Plan: General   Post-op Pain Management:    Induction: Intravenous  Airway Management Planned: Oral ETT  Additional Equipment:   Intra-op Plan:   Post-operative Plan: Extubation in OR  Informed Consent: I have reviewed the patients History and Physical, chart, labs and discussed the procedure including the risks, benefits and alternatives for the proposed anesthesia with the patient or authorized representative who has indicated his/her understanding and acceptance.   Dental Advisory Given  Plan Discussed with: Anesthesiologist, CRNA and Surgeon  Anesthesia Plan Comments:         Anesthesia Quick Evaluation

## 2016-03-03 NOTE — Transfer of Care (Signed)
Immediate Anesthesia Transfer of Care Note  Patient: [facility]  Procedure(s) Performed: Procedure(s): LIGATION RIGHT  POPLITEAL ARTERY ANEURYSM (Right) BYPASS RIGHT ABOVE POPLITEAL TO BELOW POPLITEAL USING REVERSED RIGHT GREATER SAPHENOUS VEIN (Right) RIGHT ANTERIOR TIBIAL EMBOLECTOMY (Right)  Patient Location: PACU  Anesthesia Type:General  Level of Consciousness: awake, oriented, sedated, patient cooperative and responds to stimulation  Airway & Oxygen Therapy: Patient Spontanous Breathing and Patient connected to nasal cannula oxygen  Post-op Assessment: Report given to RN, Post -op Vital signs reviewed and stable, Patient moving all extremities and Patient moving all extremities X 4  Post vital signs: Reviewed and stable  Last Vitals:  Filed Vitals:   03/03/16 0727  BP: 158/87  Pulse: 82  Temp: 36.7 C  Resp: 18    Complications: No apparent anesthesia complications

## 2016-03-03 NOTE — H&P (View-Only) (Signed)
Referring Physician: Frances Nickels [place] Patient name: Mike Wong MRN: [id number] DOB: [birth year] Sex: male  REASON FOR CONSULT: acute ischemia right leg  HPI: Mike Wong is a 70 y.o. male, with 2 hour history of coolness right foot.  Denies loss of sensation or motor function.  No prior episodes.  No history of claudication. No family history of aneurysm. This was sudden onset. Other medical problems include hypertension and elevated cholesterol.  Denies history of MI or stroke.  No history of irregular heartbeat.  He has a remote history of smoking quit 20-30 yrs ago.  Past Medical History  Diagnosis Date  . Finger fracture, right     5th finger  . High cholesterol   . Hypertension    Past Surgical History  Procedure Laterality Date  . Electrocardiogram  03/16/03  . Colonoscopy  11/04/06  . Open reduction internal fixation (orif) metacarpal Right 10/08/2015    Procedure: OPEN REDUCTION INTERNAL FIXATION (ORIF) METACARPAL RIGHT RING FINGER;  Surgeon: Daryll Brod, MD;  Location: [facility];  Service: Orthopedics;  Laterality: Right;    History reviewed. No pertinent family history.  SOCIAL HISTORY: Social History   Social History  . Marital Status: Married    Spouse Name: N/A  . Number of Children: N/A  . Years of Education: N/A   Occupational History  . Not on file.   Social History Main Topics  . Smoking status: Former Research scientist (life sciences)  . Smokeless tobacco: Not on file  . Alcohol Use: No  . Drug Use: No  . Sexual Activity: Yes   Other Topics Concern  . Not on file   Social History Narrative    No Known Allergies  No current facility-administered medications for this encounter.   Current Outpatient Prescriptions  Medication Sig Dispense Refill  . amLODipine (NORVASC) 10 MG tablet Take 1 tablet (10 mg total) by mouth daily. 90 tablet 3  . atorvastatin (LIPITOR) 10 MG tablet TAKE 1 TABLET BY MOUTH EVERY DAY 90 tablet 3  .  HYDROcodone-acetaminophen (NORCO) 5-325 MG tablet Take 1 tablet by mouth every 6 (six) hours as needed for moderate pain. 30 tablet 0    ROS:   General:  No weight loss, Fever, chills  HEENT: No recent headaches, no nasal bleeding, no visual changes, no sore throat  Neurologic: No dizziness, blackouts, seizures. No recent symptoms of stroke or mini- stroke. No recent episodes of slurred speech, or temporary blindness.  Cardiac: No recent episodes of chest pain/pressure, no shortness of breath at rest.  No shortness of breath with exertion.  Denies history of atrial fibrillation or irregular heartbeat  Vascular: No history of rest pain in feet.  No history of claudication.  No history of non-healing ulcer, No history of DVT   Pulmonary: No home oxygen, no productive cough, no hemoptysis,  No asthma or wheezing  Musculoskeletal:  [ ]  Arthritis, [ ]  Low back pain,  [ ]  Joint pain  Hematologic:No history of hypercoagulable state.  No history of easy bleeding.  No history of anemia  Gastrointestinal: No hematochezia or melena,  No gastroesophageal reflux, no trouble swallowing  Urinary: [ ]  chronic Kidney disease, [ ]  on HD - [ ]  MWF or [ ]  TTHS, [ ]  Burning with urination, [ ]  Frequent urination, [ ]  Difficulty urinating;   Skin: No rashes  Psychological: No history of anxiety,  No history of depression   Physical Examination  Filed Vitals:   02/24/16 2238  BP: 174/104  Pulse: 105  Temp: 98.7 F (37.1 C)  TempSrc: Oral  Resp: 18  Height: 5\' 11"  (1.803 m)  Weight: 175 lb (79.379 kg)  SpO2: 98%    Body mass index is 24.42 kg/(m^2).  General:  Alert and oriented, no acute distress HEENT: Normal Neck: No bruit or JVD Pulmonary: Clear to auscultation bilaterally Cardiac: Regular Rate and Rhythm Abdomen: Soft, non-tender, non-distended, no mass, no scars Skin: No rash Extremity Pulses:  2+ radial, brachial, femoral,3+ popliteal 2+ left dorsalis pedis, posterior tibial  pulse absent right DP PT pulse no doppler signals foot pale cool on right compared to left from mid calf down Musculoskeletal: No deformity or edema  Neurologic: Upper and lower extremity motor 5/5 and symmetric  DATA:  EKG sinus rhythm  BMET    Component Value Date/Time   NA 140 02/24/2016 2319   K 3.4* 02/24/2016 2319   CL 104 02/24/2016 2319   CO2 25 02/24/2016 2319   GLUCOSE 116* 02/24/2016 2319   BUN 15 02/24/2016 2319   CREATININE 1.09 02/24/2016 2319   CREATININE 0.96 03/19/2015 0914   CALCIUM 9.7 02/24/2016 2319   GFRNONAA >60 02/24/2016 2319   GFRAA >60 02/24/2016 2319    CBC    Component Value Date/Time   WBC 8.7 02/24/2016 2319   RBC 4.65 02/24/2016 2319   HGB 14.3 02/24/2016 2319   HCT 41.9 02/24/2016 2319   PLT 242 02/24/2016 2319   MCV 90.1 02/24/2016 2319   MCH 30.8 02/24/2016 2319   MCHC 34.1 02/24/2016 2319   RDW 12.7 02/24/2016 2319   LYMPHSABS 1.7 02/24/2016 2319   MONOABS 0.4 02/24/2016 2319   EOSABS 0.1 02/24/2016 2319   BASOSABS 0.0 02/24/2016 2319   ASSESSMENT:  Acute ischemia right leg suspicious for thrombosed popliteal aneurysm vs acute embolic event   PLAN:  1.  Heparin drip now  2.  CTA with bilat runoff stat  3. 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  Have discussed case with Dr Reesa Chew from interventional radiology who will attempt thrombolysis to restore distal tibial flow with interval repair of popliteal aneurysm  Plan discussed with pt.  No real change in symptoms.  Neuro motor sensory still intact.  Heparin being hung now.  Ruta Hinds, MD [facility] [place] Office: [phone] Pager: [phone]

## 2016-03-03 NOTE — Op Note (Signed)
Procedure: Right above-knee popliteal to below knee popliteal bypass,ipsilateral reversed greater saphenous vein, tibial embolectomy Preoperative diagnosis: Right popliteal aneurysm Postoperative diagnosis: Same  Anesthesia: General  Asst.: Gerri Lins PA-C, Leontine Locket, PA-C  Operative findings:  3.5 mm right greater saphenous vein reversed     Operative details: After obtaining informed consent, the patient was taken to the operating room. The patient was placed in supine position on the operating room table. After induction of general anesthesia and endotracheal intubation, a Foley catheter was placed. Next, the patient's entire right lower extremity was prepped and draped in the usual sterile fashion. Preoperative vein mapping had show the greater saphenous to be of poor quality below the knee.  Therefore, a longitudinal incision was made over the greater saphenous in the groin.  The greater saphenous vein was dissected free circumferentially and side branches ligated and divided between silk ties.  The vein was approximately 3.5 mm in diameter.  A skin bridge was made in several locations to free up the vein.  At the level above the knee the incision was deepened into the above knee popliteal space.   The above knee popliteal artery was then dissected free by reflecting the sartorius posteriorly.  The artery was pulsatile and of normal caliber at this level which was at the adductor hiatus.  A vessel loop was placed around this.   The below knee popliteal incision was then deepened through the fascia to expose the below knee popliteal vessel which was of good quality and also pulsatile.  A vessel loop was placed around this. I then proceeded to divide the anterior tibial vein in order to dissect out the origins of the tibial vessels.  Vessel loops were placed around the tibioperoneal trunk and the anterior tibial artery.   A tunnel was then created from the below knee to above knee space between  the heads of the gastrocnemius muscle and an umbilical tape placed in the tunnel.    Next the right greater saphenous vein was harvested through the skip incisions in the medial right leg. The vein was of good quality approximately 3.5 mm and uniform diameter throughout its course. Side branches were ligated and divided between silk ties or clips. The proximal and distal ends were ligated with silk ties. The vein was harvested from just above the knee to the saphenofemoral junction.   The patient was given 8000 units of heparin.    After appropriate circulation time of the heparin, the proximal left popliteal artery was controlled with a henley clamp. The artery was ligated just behind the knee joint. The vein was gently distended with heparinized saline and reversed.   The vein was then spatulated and sewn end of vein to side of artery using a running 6-0 Prolene suture. At completion the henley clamp was released and there was good pulsatile flow through the graft. The graft was marked for orientation.   The graft was then brought through the tunnel down to the below-knee popliteal artery. The below-knee popliteal artery was controlled proximally with a Henley clamp and distally with a henley clamp. A longitudinal opening was made in the distal below-knee popliteal artery in an area that was fairly free of calcification. A #3 fogarty was then passed down the anterior tibial artery several passes and some embolic material was retrieved and sent to pathology as a specimen.  I attempted to send the catheter down the tibioperoneal trunk but it would not pass easily due to tortuosity so I stopped  after one attempt.  The graft was then cut to length and spatulated and sewn end of graft to side of artery using running 6-0 Prolene suture. At completion of the anastomosis everything was forebled backbled and thoroughly flushed. The remainder of the anastomosis was completed and all clamps were removed restoring  pulsatile flow to the below-knee popliteal artery.  The below knee popliteal artery was then ligated just above the anastomosis with a 0 silk tie. There was a good doppler signal in the graft and posterior and anterior tibial artery. The patient had biphasic Doppler flow in the posterior and anterior tibial area of the foot. This augmented approximately 100% with unclamping the graft.     After hemostasis was obtained, the deep layers and subcutaneous layers of the above-knee popliteal incision were closed with running 3-0 Vicryl suture. The skin was closed with a 4-0 Vicryl subcuticular stitch. The below knee incision was inspected and found to be hemostatic. This was then closed in multiple layers of running 3-0 Vicryl suture and 4-0 subcuticular stitch. The patient tolerated the procedure well and there were no complications. Instrument sponge and needle counts were correct at the end of the case. Patient was taken to the recovery in stable condition.   Ruta Hinds, MD  [facility]  Office: [phone]  Pager: [phone]

## 2016-03-04 ENCOUNTER — Encounter (HOSPITAL_COMMUNITY): Payer: Self-pay | Admitting: Vascular Surgery

## 2016-03-04 ENCOUNTER — Inpatient Hospital Stay (HOSPITAL_COMMUNITY): Payer: Medicare Other

## 2016-03-04 DIAGNOSIS — I724 Aneurysm of artery of lower extremity: Secondary | ICD-10-CM

## 2016-03-04 LAB — CBC
HCT: 30.6 % — ABNORMAL LOW (ref 39.0–52.0)
HEMOGLOBIN: 10.3 g/dL — AB (ref 13.0–17.0)
MCH: 30.5 pg (ref 26.0–34.0)
MCHC: 33.7 g/dL (ref 30.0–36.0)
MCV: 90.5 fL (ref 78.0–100.0)
PLATELETS: 233 10*3/uL (ref 150–400)
RBC: 3.38 MIL/uL — AB (ref 4.22–5.81)
RDW: 12.8 % (ref 11.5–15.5)
WBC: 10.3 10*3/uL (ref 4.0–10.5)

## 2016-03-04 LAB — BASIC METABOLIC PANEL
ANION GAP: 11 (ref 5–15)
BUN: 10 mg/dL (ref 6–20)
CHLORIDE: 104 mmol/L (ref 101–111)
CO2: 25 mmol/L (ref 22–32)
Calcium: 8.8 mg/dL — ABNORMAL LOW (ref 8.9–10.3)
Creatinine, Ser: 0.98 mg/dL (ref 0.61–1.24)
GFR calc Af Amer: >60 mL/min (ref >60)
GLUCOSE: 147 mg/dL — AB (ref 65–99)
POTASSIUM: 3.3 mmol/L — AB (ref 3.5–5.1)
Sodium: 140 mmol/L (ref 135–145)

## 2016-03-04 MED ORDER — HYDROCODONE-ACETAMINOPHEN 5-325 MG PO TABS: 1.0000 | ORAL_TABLET | Freq: Four times a day (QID) | ORAL | Status: DC | PRN

## 2016-03-04 NOTE — Care Management Note (Signed)
Case Management Note  Patient Details  Name: Mike Wong MRN: [id number] Date of Birth: 03-15-1946  Subjective/Objective:    NCM spoke with patient, he is from home with spouse, pta indep, per pt eval no pt follow up needed.  Patient has PCP and insurance for meds, and transportation at dc.  No other needs.                 Action/Plan:   Expected Discharge Date:                  Expected Discharge Plan:  Home/Self Care  In-House Referral:     Discharge planning Services  CM Consult  Post Acute Care Choice:    Choice offered to:     DME Arranged:    DME Agency:     HH Arranged:    [place] Agency:     Status of Service:  Completed, signed off  Medicare Important Message Given:    Date Medicare IM Given:    Medicare IM give by:    Date Additional Medicare IM Given:    Additional Medicare Important Message give by:     If discussed at [place] of Stay Meetings, dates discussed:    Additional Comments:  Zenon Mayo, RN 03/04/2016, 12:09 PM

## 2016-03-04 NOTE — Progress Notes (Signed)
Discharge instructions given. No questions or concerns at this time. Pt d/c'd via family vehicle.  

## 2016-03-04 NOTE — Progress Notes (Signed)
Occupational Therapy Discharge Patient Details Name: Mike Wong MRN: [id number] DOB: March 05, 1946 Today's Date: 03/04/2016 Time:  -     Patient discharged from OT services secondary to baseline no acute needs per PT [facility] . OT to screen acutely..  Please see latest therapy progress note for current level of functioning and progress toward goals.    Progress and discharge plan discussed with patient and/or caregiver: Patient/Caregiver agrees with plan  GO     Vonita Moss   OTR/L Pager: [phone] Office: [phone] .  03/04/2016, 12:23 PM

## 2016-03-04 NOTE — Evaluation (Signed)
Physical Therapy Evaluation and Discharge Patient Details Name: Mike Wong MRN: [id number] DOB: [birth year] Today's Date: 03/04/2016   History of Present Illness  70 y.o. male who is s/p Right above-knee popliteal to below knee popliteal bypass,ipsilateral reversed greater saphenous vein, tibial embolectomy  Clinical Impression  Patient seen for mobility evaluation. Patient mobilizing well, ambulated without RW, performed functional tasks without difficulty, at this time, patient with no further acute PT needs, Will sign off.   OF NOTE: VSS during sesson    Follow Up Recommendations No PT follow up    Equipment Recommendations  None recommended by PT    Recommendations for Other Services       Precautions / Restrictions        Mobility  Bed Mobility               General bed mobility comments: received in chair  Transfers Overall transfer level: Independent Equipment used: None             General transfer comment: no physical assist or cues, no need for assistive device, performed from chair, EOB and toilet  Ambulation/Gait Ambulation/Gait assistance: Modified independent (Device/Increase time) Ambulation Distance (Feet): 410 Feet Assistive device: None;Rolling walker (2 wheeled) (RW for 100 ft, no device for 310) Gait Pattern/deviations: Step-through pattern;Antalgic Gait velocity: decreased   General Gait Details: patient ambulating well, able to ambulate without device with no assist or cues required. Decreased gait speed without device.   Stairs            Wheelchair Mobility    Modified Rankin (Stroke Patients Only)       Balance Overall balance assessment: No apparent balance deficits (not formally assessed)                                           Pertinent Vitals/Pain Pain Assessment: No/denies pain    Home Living Family/patient expects to be discharged to:: Private residence Living Arrangements:  Spouse/significant other Available Help at Discharge: Family Type of Home: House Home Access: Level entry     Home Layout: Two level Home Equipment: None      Prior Function Level of Independence: Independent               Hand Dominance   Dominant Hand: Right    Extremity/Trunk Assessment   Upper Extremity Assessment: Overall WFL for tasks assessed           Lower Extremity Assessment: RLE deficits/detail RLE Deficits / Details: RLE tightness areound incision site, increased edema       Communication   Communication: No difficulties  Cognition Arousal/Alertness: Awake/alert Behavior During Therapy: WFL for tasks assessed/performed Overall Cognitive Status: Within Functional Limits for tasks assessed                      General Comments General comments (skin integrity, edema, etc.): RLE edema noted    Exercises        Assessment/Plan    PT Assessment Patent does not need any further PT services  PT Diagnosis Difficulty walking   PT Problem List    PT Treatment Interventions     PT Goals (Current goals can be found in the Care Plan section) Acute Rehab PT Goals Patient Stated Goal: to go home PT Goal Formulation: All assessment and education complete, DC therapy    Frequency  Barriers to discharge        Co-evaluation               End of Session Equipment Utilized During Treatment: Gait belt Activity Tolerance: Patient tolerated treatment well Patient left: in chair;with call bell/phone within reach Nurse Communication: Mobility status         Time: [id number] PT Time Calculation (min) (ACUTE ONLY): 17 min   Charges:   PT Evaluation $PT Eval Low Complexity: 1 Procedure     PT G CodesDuncan Dull 03/11/16, 11:56 AM Alben Deeds, PT DPT  [phone]

## 2016-03-04 NOTE — Progress Notes (Addendum)
[facility]  Subjective  - Doing well he has walked a little and doesn't have much pain.      Objective 114/66 67 97.9 F (36.6 C) (Oral) 17 99%  Intake/Output Summary (Last 24 hours) at 03/04/16 [id number] Last data filed at 03/04/16 0700  Gross per 24 hour  Intake   4100 ml  Output   3400 ml  Net    700 ml    Doppler DP/PT biphasic, foot warm well perfused Lungs non labored breathing Heart RRR Incisions healing well without hematoma   Assessment/Planning: POD #1 70 y.o. male who is s/p Right above-knee popliteal to below knee popliteal bypass,ipsilateral reversed greater saphenous vein, tibial embolectomy   Will transfer to 2W Ambulate and plan for discharge home tomorrow    Laurence Slate [facility] 03/04/2016 8:12 AM -- Agree with above.  Monophasic right PT, biphasic AT.  No hematoma. Walk some today.  D/c home tomorrow.  ASA only.  No longer needs anticoagulation.  Ruta Hinds, MD [facility] [place] Office: [phone] Pager: [phone]  Laboratory Lab Results:  Recent Labs  03/03/16 1830 03/04/16 0346  WBC 10.4 10.3  HGB 11.9* 10.3*  HCT 35.8* 30.6*  PLT 244 233   BMET  Recent Labs  03/03/16 1830 03/04/16 0346  NA  --  140  K  --  3.3*  CL  --  104  CO2  --  25  GLUCOSE  --  147*  BUN  --  10  CREATININE 1.06 0.98  CALCIUM  --  8.8*    COAG Lab Results  Component Value Date   INR 1.13 03/03/2016   INR 1.01 02/24/2016   No results found for: PTT

## 2016-03-04 NOTE — Progress Notes (Signed)
VASCULAR LAB PRELIMINARY  ARTERIAL  ABI completed: Right sided ABI suggestive of moderate arterial insufficiency, left sided ABI is normal.     RIGHT    LEFT    PRESSURE WAVEFORM  PRESSURE WAVEFORM  BRACHIAL 117 Normal BRACHIAL 125 Normal  DP 65 Biphasic DP 142 Triphasic  PT 72 Biphasic PT 149 Triphasic  GREAT TOE  NA GREAT TOE  NA    RIGHT LEFT  ABI 0.58 1.19     Ammiel Guiney D, RVT 03/04/2016, 10:28 AM

## 2016-03-09 NOTE — Discharge Summary (Signed)
[facility] Discharge Summary   Patient ID:  Mike Wong MRN: [id number] DOB/AGE: 06/09/1946 70 y.o.  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 Finger fracture, right     5th finger  . High cholesterol   . Hypertension   . Aneurysm artery, popliteal ([place]) 02/2016    RT LEG   . Kidney stones   . Arthritis     Procedure(s): LIGATION RIGHT  POPLITEAL ARTERY ANEURYSM BYPASS RIGHT ABOVE POPLITEAL TO BELOW POPLITEAL USING REVERSED RIGHT GREATER SAPHENOUS VEIN RIGHT ANTERIOR TIBIAL EMBOLECTOMY  Discharged Condition: good  HPI: Mike Wong is a 70 y.o. male, with 2 hour history of coolness right foot. Denies loss of sensation or motor function. No prior episodes. No history of claudication. No family history of aneurysm. This was sudden onset. Other medical problems include hypertension and elevated cholesterol. Denies history of MI or stroke. No history of irregular heartbeat. He has a remote history of smoking quit 20-30 yrs ago.  The patient was admitted to the hospital and taken to interventional radiology on 02/25/2016 and underwent: Successful RLE angio with infusion cath access into the thrombosed right peroneal artery.   Hospital Course:  Mike Wong is a 70 y.o. male is S/P  Procedure(s): LIGATION RIGHT  POPLITEAL ARTERY ANEURYSM BYPASS RIGHT ABOVE POPLITEAL TO BELOW POPLITEAL USING REVERSED RIGHT GREATER SAPHENOUS VEIN RIGHT ANTERIOR TIBIAL EMBOLECTOMY  POD#1  Monophasic right PT, biphasic AT. No hematoma.  Pain well controlled, tolerating PO's Stable disposition discharge home.    Significant Diagnostic Studies: CBC Lab Results  Component Value Date   WBC  10.3 03/04/2016   HGB 10.3* 03/04/2016   HCT 30.6* 03/04/2016   MCV 90.5 03/04/2016   PLT 233 03/04/2016    BMET    Component Value Date/Time   NA 140 03/04/2016 0346   K 3.3* 03/04/2016 0346   CL 104 03/04/2016 0346   CO2 25 03/04/2016 0346   GLUCOSE 147* 03/04/2016 0346   BUN 10 03/04/2016 0346   CREATININE 0.98 03/04/2016 0346   CREATININE 0.96 03/19/2015 0914   CALCIUM 8.8* 03/04/2016 0346   GFRNONAA >60 03/04/2016 0346   GFRAA >60 03/04/2016 0346   COAG Lab Results  Component Value Date   INR 1.13 03/03/2016   INR 1.01 02/24/2016     Disposition:  Discharge to :Home Discharge Instructions    Call MD for:  redness, tenderness, or signs of infection (pain, swelling, bleeding, redness, odor or green/yellow discharge around incision site)    Complete by:  As directed      Call MD for:  severe or increased pain, loss or decreased feeling  in affected limb(s)    Complete by:  As directed      Call MD for:  temperature >100.5    Complete by:  As directed      Discharge patient    Complete by:  As directed   Discharge pt to home     Discharge wound care:    Complete by:  As directed   Wash the groin wound with soap and water daily and pat dry. (No tub bath-only shower)  Then put a dry gauze or washcloth there to keep this area dry daily and as needed.  Do not use Vaseline or neosporin on your  incisions.  Only use soap and water on your incisions and then protect and keep dry.     Driving Restrictions    Complete by:  As directed   No driving for 2 weeks     Lifting restrictions    Complete by:  As directed   No lifting for 4 weeks     Resume previous diet    Complete by:  As directed             Medication List    STOP taking these medications        enoxaparin 150 MG/ML injection  Commonly known as:  LOVENOX      TAKE these medications        amLODipine 10 MG tablet  Commonly known as:  NORVASC  Take 1 tablet (10 mg total) by mouth daily.      atorvastatin 10 MG tablet  Commonly known as:  LIPITOR  TAKE 1 TABLET BY MOUTH EVERY DAY     HYDROcodone-acetaminophen 5-325 MG tablet  Commonly known as:  NORCO  Take 1 tablet by mouth every 6 (six) hours as needed for moderate pain.     HYDROcodone-acetaminophen 5-325 MG tablet  Commonly known as:  NORCO  Take 1-2 tablets by mouth every 6 (six) hours as needed for moderate pain.       Verbal and written Discharge instructions given to the patient. Wound care per Discharge AVS     Follow-up Information    Follow up with Ruta Hinds, MD In 2 weeks.   Specialties:  Vascular Surgery, Cardiology   Why:  Office will call you to arrange your appt (sent)   Contact information:   [address] [place] [address] [phone]       Signed: Laurence Slate [facility] 03/09/2016, 1:07 PM  - For VQI Registry use --- Instructions: Press F2 to tab through selections.  Delete question if not applicable.   Post-op:  Wound infection: No  Graft infection: No  Transfusion: No  If yes,  units given New Arrhythmia: No Ipsilateral amputation: [x ] no, [ ]  Minor, [ ]  BKA, [ ]  AKA Discharge patency: [x ] Primary, [ ]  Primary assisted, [ ]  Secondary, [ ]  Occluded Patency judged by: [ ]  Dopper only, [ ]  Palpable graft pulse, [x ] Palpable distal pulse, [ ]  ABI inc. > 0.15, [ ]  Duplex Discharge ABI: R 0.58, L 1.19 D/C Ambulatory Status: Ambulatory  Complications: MI: [x ] No, [ ]  Troponin only, [ ]  EKG or Clinical CHF: No Resp failure: [x ] none, [ ]  Pneumonia, [ ]  Ventilator Chg in renal function: [x ] none, [ ]  Inc. Cr > 0.5, [ ]  Temp. Dialysis, [ ]  Permanent dialysis Stroke: [ x] None, [ ]  Minor, [ ]  Major Return to OR: No  Reason for return to OR: [ ]  Bleeding, [ ]  Infection, [ ]  Thrombosis, [ ]  Revision  Discharge medications: Statin use:  Yes ASA use:  No  for medical reason   Plavix use:  No  for medical reason   Beta blocker use: No  for medical reason   Coumadin use: No   for medical reason

## 2016-03-11 ENCOUNTER — Telehealth: Payer: Self-pay

## 2016-03-11 DIAGNOSIS — Z95828 Presence of other vascular implants and grafts: Secondary | ICD-10-CM

## 2016-03-11 DIAGNOSIS — M7989 Other specified soft tissue disorders: Secondary | ICD-10-CM

## 2016-03-11 NOTE — Telephone Encounter (Signed)
Phone call from pt.  Reported he has continued swelling in right leg and foot.  Reported has been elevating his leg above level of heart, at intervals during day.  Stated the swelling was improved when he elevated his leg, but it doesn't go down much now.  Stated the swelling starts in toes to approx. 5 " above the knee.  Denied any increased warmth or tenderness of right leg.  Stated it is "a little red around the knee incision."  Reported the incision is intact.  Denied any drainage.  Denied fever/ chills.  Denied any right calf pain with weight bearing.  Will discuss with Dr. Oneida Alar in AM, and return call to pt.  Agrees with plan.

## 2016-03-12 ENCOUNTER — Ambulatory Visit (HOSPITAL_COMMUNITY)
Admission: RE | Admit: 2016-03-12 | Discharge: 2016-03-12 | Disposition: A | Discharge status: Home / Self Care | Payer: Medicare Other | Source: Ambulatory Visit | Attending: Vascular Surgery | Admitting: Vascular Surgery

## 2016-03-12 ENCOUNTER — Ambulatory Visit (INDEPENDENT_AMBULATORY_CARE_PROVIDER_SITE_OTHER): Payer: Self-pay | Admitting: Family

## 2016-03-12 ENCOUNTER — Encounter: Payer: Self-pay | Admitting: Family

## 2016-03-12 VITALS — BP 117/74 | HR 70 | Temp 98.0°F | Resp 18 | Ht 71.0 in | Wt 169.0 lb

## 2016-03-12 DIAGNOSIS — M7989 Other specified soft tissue disorders: Secondary | ICD-10-CM

## 2016-03-12 DIAGNOSIS — E7800 Pure hypercholesterolemia, unspecified: Secondary | ICD-10-CM | POA: Insufficient documentation

## 2016-03-12 DIAGNOSIS — I10 Essential (primary) hypertension: Secondary | ICD-10-CM | POA: Diagnosis not present

## 2016-03-12 DIAGNOSIS — Z95828 Presence of other vascular implants and grafts: Secondary | ICD-10-CM

## 2016-03-12 DIAGNOSIS — I724 Aneurysm of artery of lower extremity: Secondary | ICD-10-CM

## 2016-03-12 NOTE — Telephone Encounter (Signed)
Discussed with Dr. Oneida Alar.  Recommended to schedule pt. for a right lower extremity venous ultrasound, to rule out DVT.  Will notify pt. Of appt.

## 2016-03-12 NOTE — Progress Notes (Signed)
Postoperative Visit   History of Present Illness  Mike Wong is a 70 y.o. year old male who is s/p Right above-knee popliteal to below knee popliteal bypass,ipsilateral reversed greater saphenous vein, tibial embolectomy by Dr. Oneida Alar on 03/03/16 for right popliteal aneurysm. He returns today with c/o increasing swelling in medial distal right thigh despite adequate elevation of his legs overnight and during the day. The swelling in his lower legs mostly resolves overnight, but the swelling in the medial/distal right thigh has increased. Pt states this is not painful. He denies fever or chills. His right leg and right groin incisions are healing well.  The patient is able to complete their activities of daily living.    For VQI Use Only  PRE-ADM LIVING: Home  AMB STATUS: Ambulatory  Physical Examination  Filed Vitals:   03/12/16 1155  BP: 117/74  Pulse: 70  Temp: 98 F (36.7 C)  Resp: 18  Height: 5\' 11"  (1.803 m)  Weight: 169 lb (76.658 kg)  SpO2: 99%   Body mass index is 23.58 kg/(m^2).   Right groin and medial right leg incisions are healing well with edges well proximated, no erythema, no drainage. Pedal pulses: right DP is palpable despite 2-3+ pitting edema. His right foot is warm and pink.  Right femoral pulse is palpable. Left femoral, popliteal, DP and PT pulses are palpable. Non tender swelling palpated of right lower thigh.  Medical Decision Making  [name] is a 70 y.o. year old male who presents  s/p Right above-knee popliteal to below knee popliteal bypass,ipsilateral reversed greater saphenous vein, tibial embolectomy by Dr. Oneida Alar on 03/03/16 for right popliteal aneurysm. He returns today with c/o increasing swelling in medial distal right thigh despite adequate elevation of his legs overnight and during the day. The swelling in his lower legs mostly resolves overnight, but the swelling in the medial/distal right thigh has increased. Pt  states this is not painful. He denies fever or chills. His right leg and right groin incisions are healing well.   DATA: Right LE venous duplex today finds no evidence of DVT. Above knee to below knee popliteal artery graft appears patent with triphasic flow in the distal segment Large complex fluid collection in the distal thigh measuring 4.3 cm x 13 cm. Color flow is present within this area but is not arterial and may represent fluid motion. Venous flow in the distal femoral vein appears restricted due to extrinsic compression. Small fluid collection in the proximal thigh measuring 1.4 cm x 3.6 cm.  Dr. Oneida Alar spoke with pt and wife and examined pt. Dr. Oneida Alar believes that the hematoma found on venous duplex today will likely resolve, be resorbed. Dr. Oneida Alar advised to allow this time to resorb; if the swelling does not subside can take pt to OR for drainage of fluid collection. Pt states he agrees with allowing fluid to resorb with adequate elevation of his legs. I instructed pt how to adequately elevate his legs. I advised pt to notify us for further concerns or problems re his right leg.  At Dr. Oneida Alar suggestion, mild compression ace wrap applied from right foot to thigh; protective gauze and kerlex dressings to incisions under ace wrap. Pt states he can take OTC Tylenol for pain at this point.  Follow up with Dr. Oneida Alar as already scheduled on 03/19/16 for post op check.        The patient's bypass incisions are healing appropriately. I discussed in depth with the patient the  nature of atherosclerosis, and emphasized the importance of maximal medical management including strict control of blood pressure, blood glucose, and lipid levels, obtaining regular exercise, and cessation of smoking.  The patient is aware that without maximal medical management the underlying atherosclerotic disease process will progress, limiting the benefit of any interventions.  Thank you for allowing [place] to  participate in this patient's care.  Britteney Ayotte, Sharmon Leyden, RN, MSN, FNP-C [facility] [place] Office: [phone]  03/12/2016, 12:28 PM  Clinic MD: Oneida Alar

## 2016-03-13 ENCOUNTER — Encounter: Payer: Self-pay | Admitting: Vascular Surgery

## 2016-03-19 ENCOUNTER — Encounter: Payer: Self-pay | Admitting: Family Medicine

## 2016-03-19 ENCOUNTER — Ambulatory Visit (INDEPENDENT_AMBULATORY_CARE_PROVIDER_SITE_OTHER): Payer: Medicare Other | Admitting: Vascular Surgery

## 2016-03-19 ENCOUNTER — Encounter: Payer: Self-pay | Admitting: Vascular Surgery

## 2016-03-19 ENCOUNTER — Ambulatory Visit (INDEPENDENT_AMBULATORY_CARE_PROVIDER_SITE_OTHER): Payer: Medicare Other | Admitting: Family Medicine

## 2016-03-19 VITALS — BP 124/83 | HR 84 | Temp 98.1°F | Ht 70.0 in | Wt 165.3 lb

## 2016-03-19 VITALS — BP 114/70 | HR 74 | Ht 70.0 in | Wt 166.0 lb

## 2016-03-19 DIAGNOSIS — Z1211 Encounter for screening for malignant neoplasm of colon: Secondary | ICD-10-CM

## 2016-03-19 DIAGNOSIS — Z86010 Personal history of colonic polyps: Secondary | ICD-10-CM | POA: Diagnosis not present

## 2016-03-19 DIAGNOSIS — Z87442 Personal history of urinary calculi: Secondary | ICD-10-CM | POA: Diagnosis not present

## 2016-03-19 DIAGNOSIS — Z1159 Encounter for screening for other viral diseases: Secondary | ICD-10-CM

## 2016-03-19 DIAGNOSIS — Z8639 Personal history of other endocrine, nutritional and metabolic disease: Secondary | ICD-10-CM

## 2016-03-19 DIAGNOSIS — I724 Aneurysm of artery of lower extremity: Secondary | ICD-10-CM

## 2016-03-19 DIAGNOSIS — N400 Enlarged prostate without lower urinary tract symptoms: Secondary | ICD-10-CM | POA: Diagnosis not present

## 2016-03-19 DIAGNOSIS — E785 Hyperlipidemia, unspecified: Secondary | ICD-10-CM | POA: Diagnosis not present

## 2016-03-19 DIAGNOSIS — I10 Essential (primary) hypertension: Secondary | ICD-10-CM | POA: Diagnosis not present

## 2016-03-19 LAB — LIPID PANEL
CHOLESTEROL: 176 mg/dL (ref 125–200)
HDL: 53 mg/dL (ref >=40)
LDL CALC: 93 mg/dL (ref <130)
Total CHOL/HDL Ratio: 3.3 Ratio (ref <=5.0)
Triglycerides: 148 mg/dL (ref <150)
VLDL: 30 mg/dL (ref <30)

## 2016-03-19 MED ORDER — AMLODIPINE BESYLATE 10 MG PO TABS: 10.0000 mg | ORAL_TABLET | Freq: Every day | ORAL | Status: DC

## 2016-03-19 MED ORDER — ATORVASTATIN CALCIUM 10 MG PO TABS: 10.0000 mg | ORAL_TABLET | Freq: Every day | ORAL | Status: DC

## 2016-03-19 MED ORDER — CEPHALEXIN 500 MG PO CAPS: 500.0000 mg | ORAL_CAPSULE | Freq: Three times a day (TID) | ORAL | Status: DC

## 2016-03-19 NOTE — Progress Notes (Signed)
Subjective:   HPI  Mike Wong is a 70 y.o. male who presents for a complete physical.He had a recent admission and treatment for popliteal artery aneurysm and subsequent clotting. He did have a bypass done on this and is now recuperating very nicely from this. The hospital record concerning this was reviewed in detail with him. He continues on Norvasc and is having no difficulty with this. He takes atorvastatin again without any problem. Does have a previous history of colonic polyps with his last Colonoscopy was in 2007. He was requested to return sooner but has not done so. He also has a previous history of vitamin D deficiency. He has not had any difficulty with renal stones. He does not complain of any urinary symptoms at the present time. Medical care team includes:  Leanora Ivanoff  [facility].  Preventative care: Last ophthalmology visit:2016 Last dental visit:1/17 Last colonoscopy: 2007 due in [place] Last prostate exam: N/A Last EKG:02/28/16 Last labs:Recent hospitalization  Prior vaccinations: TD or Tdap:2009 Influenza: Pneumococcal:23: 2011/ 13: 2016 Shingles/Zostavax: 2011  Advanced directive:Yes Health care power of attorney:Yes Living will:Yes   Reviewed their medical, surgical, family, social, medication, and allergy history and updated chart as appropriate.    Review of Systems Constitutional: -fever, -chills, -sweats, -unexpected weight change, -decreased appetite, -fatigue Allergy: -sneezing, -itching, -congestion Dermatology: -changing moles, --rash, -lumps ENT: -runny nose, -ear pain, -sore throat, -hoarseness, -sinus pain, -teeth pain, - ringing in ears, -hearing loss, -nosebleeds Cardiology: -chest pain, -palpitations, -swelling, -difficulty breathing when lying flat, -waking up short of breath Respiratory: -cough, -shortness of breath, -difficulty breathing with exercise or exertion, -wheezing, -coughing up blood Gastroenterology: -abdominal pain, -nausea,  -vomiting, -diarrhea, -constipation, -blood in stool, -changes in bowel movement, -difficulty swallowing or eating Hematology: -bleeding, -bruising  Musculoskeletal: -joint aches, -muscle aches, -joint swelling, -back pain, -neck pain, -cramping, -changes in gait Ophthalmology: denies vision changes, eye redness, itching, discharge Urology: -burning with urination, -difficulty urinating, -blood in urine, -urinary frequency, -urgency, -incontinence Neurology: -headache, -weakness, -tingling, -numbness, -memory loss, -falls, -dizziness Psychology: -depressed mood, -agitation, -sleep problems     Objective:   Physical Exam   General appearance: alert, no distress, WD/WN,  Exam of his right leg does show a healing scar with some distal edema noted just proximal to the patella.  Assessment and Plan :  Essential hypertension - Plan: amLODipine (NORVASC) 10 MG tablet  Hyperlipidemia with target LDL less than 130 - Plan: Lipid panel, atorvastatin (LIPITOR) 10 MG tablet  Popliteal artery aneurysm (HCC)  History of colonic polyps - Plan: Ambulatory referral to Gastroenterology  History of vitamin D deficiency - Plan: VITAMIN D 25 Hydroxy (Vit-D Deficiency, Fractures)  History of renal stone  BPH (benign prostatic hyperplasia)  Need for hepatitis C screening test - Plan: Hepatitis C antibody  Screening for colon cancer - Plan: Ambulatory referral to Gastroenterology, CANCELED: Cologuard   Encouraged him to continue to take good care of himself. He will follow-up with cardiovascular as previously scheduled.   Physical exam - discussed healthy lifestyle, diet, exercise, preventative care, vaccinations, and addressed their concerns.   Follow-up

## 2016-03-19 NOTE — Progress Notes (Signed)
Patient is a 70 year old male who returns for follow-up today. He recently underwent right above-knee to below-knee popliteal bypass with ligation of a popliteal aneurysm. This was on 03/03/2016. He was seen approximately one week ago with swelling in his right leg. Duplex ultrasound showed most likely what was a hematoma in the mid thigh. He states that the swelling in his right leg has improved although it has not completely returned to baseline. He denies any claudication symptoms. He has no pain or numbness in his right foot. He also complains of some pain and redness near the left antecubital area  physical exam:  Healing incisions right leg right foot pink warm and pedal pulses some edema extending to the knee level and in the right mid thigh.  Left upper extremity: Palpable cord left forearm with some erythema near the antecubital area over vein.  Assessment/Plan: Patent bypass right leg after recent above the below-knee popliteal bypass for popliteal aneurysm. Postop hematoma with some residual leg swelling although the overall improved. The patient will follow-up with [place] in July 2017 with a graft duplex and ABIs at that point.  Superficial thrombophlebitis most likely from a blood draw during his hospitalization. The patient was started on Keflex 500 mg 3 times a day for 10 day course. He'll follow-up with [place] sooner if this does not resolve after his antibiotic course. We'll also put warm compresses over the area for comfort.  Ruta Hinds, MD [facility] [place] Office: [phone] Pager: [phone]

## 2016-03-20 LAB — HEPATITIS C ANTIBODY: HCV Ab: NEGATIVE

## 2016-03-20 LAB — VITAMIN D 25 HYDROXY (VIT D DEFICIENCY, FRACTURES): Vit D, 25-Hydroxy: 33 ng/mL (ref 30–100)

## 2016-04-30 ENCOUNTER — Other Ambulatory Visit: Payer: Self-pay | Admitting: *Deleted

## 2016-04-30 DIAGNOSIS — I739 Peripheral vascular disease, unspecified: Secondary | ICD-10-CM

## 2016-04-30 DIAGNOSIS — Z48812 Encounter for surgical aftercare following surgery on the circulatory system: Secondary | ICD-10-CM

## 2016-05-22 ENCOUNTER — Ambulatory Visit (INDEPENDENT_AMBULATORY_CARE_PROVIDER_SITE_OTHER)
Admission: RE | Admit: 2016-05-22 | Discharge: 2016-05-22 | Disposition: A | Discharge status: Home / Self Care | Payer: Medicare Other | Source: Ambulatory Visit | Attending: Vascular Surgery | Admitting: Vascular Surgery

## 2016-05-22 ENCOUNTER — Encounter: Payer: Self-pay | Admitting: Vascular Surgery

## 2016-05-22 ENCOUNTER — Ambulatory Visit (HOSPITAL_COMMUNITY)
Admission: RE | Admit: 2016-05-22 | Discharge: 2016-05-22 | Disposition: A | Discharge status: Home / Self Care | Payer: Medicare Other | Source: Ambulatory Visit | Attending: Vascular Surgery | Admitting: Vascular Surgery

## 2016-05-22 DIAGNOSIS — Z87891 Personal history of nicotine dependence: Secondary | ICD-10-CM | POA: Diagnosis not present

## 2016-05-22 DIAGNOSIS — I739 Peripheral vascular disease, unspecified: Secondary | ICD-10-CM | POA: Insufficient documentation

## 2016-05-22 DIAGNOSIS — Z48812 Encounter for surgical aftercare following surgery on the circulatory system: Secondary | ICD-10-CM

## 2016-05-22 DIAGNOSIS — E785 Hyperlipidemia, unspecified: Secondary | ICD-10-CM | POA: Insufficient documentation

## 2016-05-22 DIAGNOSIS — Z95828 Presence of other vascular implants and grafts: Secondary | ICD-10-CM | POA: Insufficient documentation

## 2016-05-22 DIAGNOSIS — R936 Abnormal findings on diagnostic imaging of limbs: Secondary | ICD-10-CM | POA: Insufficient documentation

## 2016-05-22 DIAGNOSIS — I10 Essential (primary) hypertension: Secondary | ICD-10-CM | POA: Diagnosis not present

## 2016-05-28 ENCOUNTER — Ambulatory Visit (INDEPENDENT_AMBULATORY_CARE_PROVIDER_SITE_OTHER): Payer: Self-pay | Admitting: Vascular Surgery

## 2016-05-28 ENCOUNTER — Encounter: Payer: Self-pay | Admitting: Vascular Surgery

## 2016-05-28 VITALS — BP 140/80 | HR 86 | Temp 97.4°F | Resp 16 | Ht 70.0 in | Wt 170.0 lb

## 2016-05-28 DIAGNOSIS — I724 Aneurysm of artery of lower extremity: Secondary | ICD-10-CM

## 2016-05-28 NOTE — Progress Notes (Signed)
Patient is a 70 year old male who returns for follow-up today. He recently underwent right above-knee to below-knee popliteal bypass with ligation of a popliteal aneurysm. This was on 03/03/2016.He denies any claudication symptoms. He has no pain or numbness in his right foot.  He now has a palpable cord in the left forearm from previous thrombophlebitis. This is not bothersome to him. He does get some swelling in his right lower extremity which progresses as the day goes along. It is improved by the next day. It is not really bothersome to him.  Prior CTA showed some calcification in the left lower extremity but no popliteal aneurysm.  Review of systems: Denies shortness of breath. He denies chest pain.  Physical exam:  Filed Vitals:   05/28/16 1001  BP: 140/80  Pulse: 86  Temp: 97.4 F (36.3 C)  TempSrc: Oral  Resp: 16  Height: 5\' 10"  (1.778 m)  Weight: 170 lb (77.111 kg)  SpO2: 98%     Healed incisions right leg right foot pink warm no palpable pedal pulses some edema extending to the knee level and in the right mid thigh.  Left upper extremity: Palpable cord left forearm nontender  Assessment/Plan: Patent bypass right leg after recent above the below-knee popliteal bypass for popliteal aneurysm. Postop hematoma with some residual leg swelling although the overall improved. The patient will follow-up in one year with a graft duplex and ABIs at that point. He will try compression stocking on the right leg to improve swelling symptoms. It is okay for him to return full time at work   Ruta Hinds, MD [facility] [place]: [phone] Pager: [phone]

## 2016-08-02 ENCOUNTER — Other Ambulatory Visit: Payer: Self-pay | Admitting: Family Medicine

## 2016-08-27 ENCOUNTER — Encounter: Payer: Self-pay | Admitting: Gastroenterology

## 2016-09-29 NOTE — Addendum Note (Signed)
Addended by: Mena Goes on: 09/29/2016 04:08 PM   Modules accepted: Orders

## 2016-10-14 ENCOUNTER — Ambulatory Visit (AMBULATORY_SURGERY_CENTER): Payer: Self-pay | Admitting: *Deleted

## 2016-10-14 VITALS — Ht 70.0 in | Wt 170.0 lb

## 2016-10-14 DIAGNOSIS — Z1211 Encounter for screening for malignant neoplasm of colon: Secondary | ICD-10-CM

## 2016-10-14 MED ORDER — NA SULFATE-K SULFATE-MG SULF 17.5-3.13-1.6 GM/177ML PO SOLN: 1.0000 | Freq: Once | ORAL | 0 refills | Status: AC

## 2016-10-14 NOTE — Progress Notes (Signed)
No egg or soy allergy known to patient  No issues with past sedation with any surgeries  or procedures, no intubation problems  No diet pills per patient No home 02 use per patient  No blood thinners per patient  Pt denies issues with constipation  No A fib or A flutter   

## 2016-10-28 ENCOUNTER — Encounter: Payer: Self-pay | Admitting: Gastroenterology

## 2016-10-28 ENCOUNTER — Ambulatory Visit (AMBULATORY_SURGERY_CENTER): Payer: Medicare Other | Admitting: Gastroenterology

## 2016-10-28 VITALS — BP 111/70 | HR 51 | Temp 98.4°F | Resp 12 | Ht 70.0 in | Wt 170.0 lb

## 2016-10-28 DIAGNOSIS — D120 Benign neoplasm of cecum: Secondary | ICD-10-CM

## 2016-10-28 DIAGNOSIS — D123 Benign neoplasm of transverse colon: Secondary | ICD-10-CM

## 2016-10-28 DIAGNOSIS — D125 Benign neoplasm of sigmoid colon: Secondary | ICD-10-CM | POA: Diagnosis not present

## 2016-10-28 DIAGNOSIS — Z86010 Personal history of colonic polyps: Secondary | ICD-10-CM | POA: Diagnosis present

## 2016-10-28 DIAGNOSIS — D122 Benign neoplasm of ascending colon: Secondary | ICD-10-CM

## 2016-10-28 DIAGNOSIS — K635 Polyp of colon: Secondary | ICD-10-CM

## 2016-10-28 MED ORDER — SODIUM CHLORIDE 0.9 % IV SOLN: 500.0000 mL | INTRAVENOUS | Status: DC

## 2016-10-28 NOTE — Op Note (Signed)
[facility] Patient Name: Mike Wong Procedure Date: 10/28/2016 11:06 AM MRN: [id number] Endoscopist: Remo Lipps P. Zuha Dejonge MD, MD Age: 70 Referring MD:  Date of Birth: 1946/07/20 Gender: Male Account #: [id number] Procedure:                Colonoscopy Indications:              High risk colon cancer surveillance: Personal                            history of colonic polyps Medicines:                Monitored Anesthesia Care Procedure:                Pre-Anesthesia Assessment:                           - Prior to the procedure, a History and Physical                            was performed, and patient medications and                            allergies were reviewed. The patient's tolerance of                            previous anesthesia was also reviewed. The risks                            and benefits of the procedure and the sedation                            options and risks were discussed with the patient.                            All questions were answered, and informed consent                            was obtained. Prior Anticoagulants: The patient has                            taken aspirin, last dose was 1 day prior to                            procedure. ASA Grade Assessment: III - A patient                            with severe systemic disease. After reviewing the                            risks and benefits, the patient was deemed in                            satisfactory condition to undergo the procedure.  After obtaining informed consent, the colonoscope                            was passed under direct vision. Throughout the                            procedure, the patient's blood pressure, pulse, and                            oxygen saturations were monitored continuously. The                            Model CF-HQ190L [id number]) scope was introduced                            through the anus and  advanced to the the cecum,                            identified by appendiceal orifice and ileocecal                            valve. The colonoscopy was performed without                            difficulty. The patient tolerated the procedure                            well. The quality of the bowel preparation was                            good. The ileocecal valve, appendiceal orifice, and                            rectum were photographed. Scope In: 11:09:44 AM Scope Out: 11:33:13 AM Scope Withdrawal Time: 0 hours 20 minutes 18 seconds  Total Procedure Duration: 0 hours 23 minutes 29 seconds  Findings:                 The perianal and digital rectal examinations were                            normal.                           Two sessile polyps were found in the cecum. The                            polyps were 4 to 6 mm in size. These polyps were                            removed with a cold snare. Resection and retrieval                            were complete.  A 6 mm polyp was found in the ascending colon. The                            polyp was sessile. The polyp was removed with a                            cold snare. Resection and retrieval were complete.                           A 5 mm polyp was found in the transverse colon. The                            polyp was sessile. The polyp was removed with a                            cold snare. Resection and retrieval were complete.                           A 10 mm polyp was found in the sigmoid colon. The                            polyp was pedunculated. The polyp was removed with                            a hot snare. Resection and retrieval were complete.                           A few medium-mouthed diverticula were found in the                            left colon.                           Internal hemorrhoids were found during retroflexion.                           The exam was  otherwise without abnormality. Complications:            No immediate complications. Estimated blood loss:                            Minimal. Estimated Blood Loss:     Estimated blood loss was minimal. Impression:               - Two 4 to 6 mm polyps in the cecum, removed with a                            cold snare. Resected and retrieved.                           - One 6 mm polyp in the ascending colon, removed  with a cold snare. Resected and retrieved.                           - One 5 mm polyp in the transverse colon, removed                            with a cold snare. Resected and retrieved.                           - One 10 mm polyp in the sigmoid colon, removed                            with a hot snare. Resected and retrieved.                           - Diverticulosis in the left colon.                           - Internal hemorrhoids.                           - The examination was otherwise normal. Recommendation:           - Patient has a contact number available for                            emergencies. The signs and symptoms of potential                            delayed complications were discussed with the                            patient. Return to normal activities tomorrow.                            Written discharge instructions were provided to the                            patient.                           - Resume previous diet.                           - Continue present medications.                           - No ibuprofen, naproxen, or other non-steroidal                            anti-inflammatory drugs for 2 weeks after polyp                            removal.                           -  Await pathology results.                           - Repeat colonoscopy is recommended for                            surveillance. The colonoscopy date will be                            determined after pathology results from  today's                            exam become available for review. Remo Lipps P. Cace Osorto MD, MD 10/28/2016 11:38:46 AM This report has been signed electronically.

## 2016-10-28 NOTE — Patient Instructions (Signed)
YOU HAD AN ENDOSCOPIC PROCEDURE TODAY AT [facility]:   Refer to the procedure report that was given to you for any specific questions about what was found during the examination.  If the procedure report does not answer your questions, please call your gastroenterologist to clarify.  If you requested that your care partner not be given the details of your procedure findings, then the procedure report has been included in a sealed envelope for you to review at your convenience later.  YOU SHOULD EXPECT: Some feelings of bloating in the abdomen. Passage of more gas than usual.  Walking can help get rid of the air that was put into your GI tract during the procedure and reduce the bloating. If you had a lower endoscopy (such as a colonoscopy or flexible sigmoidoscopy) you may notice spotting of blood in your stool or on the toilet paper. If you underwent a bowel prep for your procedure, you may not have a normal bowel movement for a few days.  Please Note:  You might notice some irritation and congestion in your nose or some drainage.  This is from the oxygen used during your procedure.  There is no need for concern and it should clear up in a day or so.  SYMPTOMS TO REPORT IMMEDIATELY:   Following lower endoscopy (colonoscopy or flexible sigmoidoscopy):  Excessive amounts of blood in the stool  Significant tenderness or worsening of abdominal pains  Swelling of the abdomen that is new, acute  Fever of 100F or higher    For urgent or emergent issues, a gastroenterologist can be reached at any hour by calling [phone].   DIET:  We do recommend a small meal at first, but then you may proceed to your regular diet.  Drink plenty of fluids but you should avoid alcoholic beverages for 24 hours.  ACTIVITY:  You should plan to take it easy for the rest of today and you should NOT DRIVE or use heavy machinery until tomorrow (because of the sedation medicines used during the test).     FOLLOW UP: Our staff will call the number listed on your records the next business day following your procedure to check on you and address any questions or concerns that you may have regarding the information given to you following your procedure. If we do not reach you, we will leave a message.  However, if you are feeling well and you are not experiencing any problems, there is no need to return our call.  We will assume that you have returned to your regular daily activities without incident.  If any biopsies were taken you will be contacted by phone or by letter within the next 1-3 weeks.  Please call us at [phone] if you have not heard about the biopsies in 3 weeks.    SIGNATURES/CONFIDENTIALITY: You and/or your care partner have signed paperwork which will be entered into your electronic medical record.  These signatures attest to the fact that that the information above on your After Visit Summary has been reviewed and is understood.  Full responsibility of the confidentiality of this discharge information lies with you and/or your care-partner.   No ibuprofen,naproxen,or other non-steroidal anti-inflammatory drugs for 2 weeks,resume remainder of medications. Information given on polyps,hemorrhoids and diverticulosis,high fiber diet.

## 2016-10-28 NOTE — Progress Notes (Signed)
Called to room to assist during endoscopic procedure.  Patient ID and intended procedure confirmed with present staff. Received instructions for my participation in the procedure from the performing physician.  

## 2016-10-28 NOTE — Progress Notes (Signed)
Spontaneous respirations throughout. VSS. Resting comfortably. To PACU on room air. Report to  RN. 

## 2016-10-29 ENCOUNTER — Telehealth: Payer: Self-pay

## 2016-10-29 NOTE — Telephone Encounter (Signed)
Attempted to reach pt. Following endoscopic procedure yesterday for follow up call.   Will try to reach pt. Later today.

## 2016-10-29 NOTE — Telephone Encounter (Signed)
  Follow up Call-  Call back number 10/28/2016  Post procedure Call Back phone  # [phone]  Permission to leave phone message Yes  Some recent data might be hidden     Patient questions:  Do you have a fever, pain , or abdominal swelling? No. Pain Score  0 *  Have you tolerated food without any problems? Yes.    Have you been able to return to your normal activities? Yes.    Do you have any questions about your discharge instructions: Diet   No. Medications  No. Follow up visit  No.  Do you have questions or concerns about your Care? No.  Actions: * If pain score is 4 or above: No action needed, pain <4.

## 2016-11-04 ENCOUNTER — Encounter: Payer: Self-pay | Admitting: Gastroenterology

## 2016-12-17 DIAGNOSIS — H6121 Impacted cerumen, right ear: Secondary | ICD-10-CM | POA: Diagnosis not present

## 2017-01-29 DIAGNOSIS — H25013 Cortical age-related cataract, bilateral: Secondary | ICD-10-CM | POA: Diagnosis not present

## 2017-01-29 DIAGNOSIS — H2513 Age-related nuclear cataract, bilateral: Secondary | ICD-10-CM | POA: Diagnosis not present

## 2017-03-22 ENCOUNTER — Encounter: Payer: Self-pay | Admitting: Family Medicine

## 2017-03-22 ENCOUNTER — Ambulatory Visit (INDEPENDENT_AMBULATORY_CARE_PROVIDER_SITE_OTHER): Payer: Medicare Other | Admitting: Family Medicine

## 2017-03-22 VITALS — BP 100/60 | HR 78 | Ht 70.0 in | Wt 172.0 lb

## 2017-03-22 DIAGNOSIS — I10 Essential (primary) hypertension: Secondary | ICD-10-CM

## 2017-03-22 DIAGNOSIS — E785 Hyperlipidemia, unspecified: Secondary | ICD-10-CM

## 2017-03-22 DIAGNOSIS — N401 Enlarged prostate with lower urinary tract symptoms: Secondary | ICD-10-CM

## 2017-03-22 DIAGNOSIS — Z860100 Personal history of colon polyps, unspecified: Secondary | ICD-10-CM

## 2017-03-22 DIAGNOSIS — Z8639 Personal history of other endocrine, nutritional and metabolic disease: Secondary | ICD-10-CM | POA: Diagnosis not present

## 2017-03-22 DIAGNOSIS — I724 Aneurysm of artery of lower extremity: Secondary | ICD-10-CM | POA: Diagnosis not present

## 2017-03-22 LAB — CBC WITH DIFFERENTIAL/PLATELET
BASOS ABS: 0 {cells}/uL (ref 0–200)
BASOS PCT: 0 %
EOS PCT: 2 %
Eosinophils Absolute: 132 cells/uL (ref 15–500)
HCT: 42.5 % (ref 38.5–50.0)
HEMOGLOBIN: 14.4 g/dL (ref 13.2–17.1)
LYMPHS ABS: 1122 {cells}/uL (ref 850–3900)
Lymphocytes Relative: 17 %
MCH: 31.2 pg (ref 27.0–33.0)
MCHC: 33.9 g/dL (ref 32.0–36.0)
MCV: 92 fL (ref 80.0–100.0)
MONOS PCT: 6 %
MPV: 9.8 fL (ref 7.5–12.5)
Monocytes Absolute: 396 cells/uL (ref 200–950)
NEUTROS ABS: 4950 {cells}/uL (ref 1500–7800)
Neutrophils Relative %: 75 %
PLATELETS: 290 10*3/uL (ref 140–400)
RBC: 4.62 MIL/uL (ref 4.20–5.80)
RDW: 13.4 % (ref 11.0–15.0)
WBC: 6.6 10*3/uL (ref 4.0–10.5)

## 2017-03-22 LAB — COMPREHENSIVE METABOLIC PANEL
ALBUMIN: 4.3 g/dL (ref 3.6–5.1)
ALT: 24 U/L (ref 9–46)
AST: 21 U/L (ref 10–35)
Alkaline Phosphatase: 80 U/L (ref 40–115)
BUN: 12 mg/dL (ref 7–25)
CO2: 25 mmol/L (ref 20–31)
Calcium: 9.3 mg/dL (ref 8.6–10.3)
Chloride: 106 mmol/L (ref 98–110)
Creat: 1.09 mg/dL (ref 0.70–1.25)
Glucose, Bld: 100 mg/dL — ABNORMAL HIGH (ref 65–99)
Potassium: 3.8 mmol/L (ref 3.5–5.3)
SODIUM: 142 mmol/L (ref 135–146)
TOTAL PROTEIN: 6.9 g/dL (ref 6.1–8.1)
Total Bilirubin: 0.5 mg/dL (ref 0.2–1.2)

## 2017-03-22 LAB — LIPID PANEL
CHOLESTEROL: 197 mg/dL (ref <200)
HDL: 71 mg/dL (ref >40)
LDL CALC: 97 mg/dL (ref <100)
TRIGLYCERIDES: 145 mg/dL (ref <150)
Total CHOL/HDL Ratio: 2.8 Ratio (ref <5.0)
VLDL: 29 mg/dL (ref <30)

## 2017-03-22 MED ORDER — AMLODIPINE BESYLATE 10 MG PO TABS: 10.0000 mg | ORAL_TABLET | Freq: Every day | ORAL | 3 refills | Status: DC

## 2017-03-22 MED ORDER — ATORVASTATIN CALCIUM 10 MG PO TABS: 10.0000 mg | ORAL_TABLET | Freq: Every day | ORAL | 3 refills | Status: DC

## 2017-03-22 NOTE — Progress Notes (Signed)
Subjective:   HPI  Mike Wong is a 71 y.o. male who presents for Chief Complaint  Patient presents with  . Medicare Wellness    med check plus    Medical care team includes: [name], MD here for primary care    Preventative care: [employer] Last ophthalmology visit: 1 month ago Last dental visit: 3/18 Last colonoscopy:11/15/16 Last prostate exam: -- Last EKG:02/28/16 Last labs:03/19/16  Prior vaccinations:  TD or Tdap:05/22/08 Influenza:N/A Pneumococcal:23:10/22/10 13:03/19/15 Shingles/Zostavax:10/22/10.Instructed to get Shingrix at the drugstore   Advanced directive: Yes.copy requested  Concerns: He continues on Lipitor for his hyperlipidemia and is having no difficulty with this. He will be following up with vascular surgery concerning the popliteal artery aneurysm. He does have a previous history of colonic polyps and did have a colonoscopy in 2017. He continues on amlodipine for his hypertension. Presently he is having no BPH symptoms. His last vitamin D level was 33. He continues to work. His marriage is stable.   Reviewed their medical, surgical, family, social, medication, and allergy history and updated chart as appropriate.  Past Medical History:  Diagnosis Date  . 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 Arthritis   . Clotting disorder (HCC)    leg artery aneurysm   . Finger fracture, right    5th finger  . High cholesterol   . Hypertension   . Kidney stones     Past Surgical History:  Procedure Laterality Date  . BYPASS GRAFT POPLITEAL TO POPLITEAL Right 03/03/2016   Procedure: BYPASS RIGHT ABOVE POPLITEAL TO BELOW POPLITEAL USING REVERSED RIGHT GREATER SAPHENOUS VEIN;  Surgeon: Elam Dutch, MD;  Location: [facility];  Service: Vascular;  Laterality: Right;  . COLONOSCOPY  11/04/06  . ELECTROCARDIOGRAM  03/16/03  . EMBOLECTOMY Right 03/03/2016   Procedure: RIGHT ANTERIOR TIBIAL EMBOLECTOMY;  Surgeon: Elam Dutch, MD;  Location: [facility];  Service: Vascular;  Laterality: Right;  . FALSE ANEURYSM REPAIR Right 03/03/2016   Procedure: LIGATION RIGHT  POPLITEAL ARTERY ANEURYSM;  Surgeon: Elam Dutch, MD;  Location: [facility];  Service: Vascular;  Laterality: Right;  . LITHOTRIPSY    . LITHOTRIPSY    . OPEN REDUCTION INTERNAL FIXATION (ORIF) METACARPAL Right 10/08/2015   Procedure: OPEN REDUCTION INTERNAL FIXATION (ORIF) METACARPAL RIGHT RING FINGER;  Surgeon: Daryll Brod, MD;  Location: [facility];  Service: Orthopedics;  Laterality: Right;    Social History   Social History  . Marital status: Married    Spouse name: N/A  . Number of children: N/A  . Years of education: N/A   Occupational History  . Not on file.   Social History Main Topics  . Smoking status: Former Research scientist (life sciences)  . Smokeless tobacco: Never Used     Comment: QUIT SMOKING IN 1982  . Alcohol use 0.6 oz/week    1 Cans of beer per week     Comment: or less  . Drug use: No  . Sexual activity: Yes   Other Topics Concern  . Not on file   Social History Narrative  . No narrative on file    Family History  Problem Relation Age of Onset  . Colon cancer Neg Hx   . Colon polyps Neg Hx      Current Outpatient Prescriptions:  .  amLODipine (NORVASC) 10 MG tablet, Take 1 tablet (10 mg total) by mouth daily., Disp: 90 tablet, Rfl: 3 .  aspirin 81 MG chewable tablet, Chew 81  mg by mouth daily., Disp: , Rfl:  .  atorvastatin (LIPITOR) 10 MG tablet, Take 1 tablet (10 mg total) by mouth daily., Disp: 90 tablet, Rfl: 3  Current Facility-Administered Medications:  .  0.9 %  sodium chloride infusion, 500 mL, Intravenous, Continuous, Armbruster, Renelda Loma, MD  No Known Allergies     Review of Systems      Objective:  General appearance: alert, no distress, WD/WN, Caucasian male Skin: Normal HEENT: normocephalic, conjunctiva/corneas normal, sclerae anicteric, PERRLA, EOMi, nares patent, no discharge or erythema, pharynx normal Oral  cavity: MMM, tongue normal, teeth normal Neck: supple, no lymphadenopathy, no thyromegaly, no masses, normal ROM, no bruits Chest: non tender, normal shape and expansion Heart: RRR, normal S1, S2, no murmurs Lungs: CTA bilaterally, no wheezes, rhonchi, or rales Abdomen: +bs, soft, non tender, non distended, no masses, no hepatomegaly, no splenomegaly, no bruits  Musculoskeletal: upper extremities non tender, no obvious deformity, normal ROM throughout, lower extremities non tender, no obvious deformity, normal ROM throughout Extremities: no edema, no cyanosis, no clubbing Pulses: 2+ symmetric, upper and lower extremities, normal cap refill Neurological: alert, oriented x 3, CN2-12 intact, strength normal upper extremities and lower extremities, sensation normal throughout, DTRs 2+ throughout, no cerebellar signs, gait normal Psychiatric: normal affect, behavior normal, pleasant    Assessment and Plan :    Essential hypertension  History of colonic polyps  Hyperlipidemia with target LDL less than 130  History of vitamin D deficiency  Benign prostatic hyperplasia with lower urinary tract symptoms, symptom details unspecified  Popliteal artery aneurysm [facility])   Physical exam - discussed and counseled on healthy lifestyle, diet, exercise, preventative care, vaccinations, sick and well care, proper use of emergency dept and after hours care, and addressed their concerns.

## 2017-05-03 ENCOUNTER — Other Ambulatory Visit: Payer: Self-pay | Admitting: Family Medicine

## 2017-05-03 DIAGNOSIS — E785 Hyperlipidemia, unspecified: Secondary | ICD-10-CM

## 2017-06-04 ENCOUNTER — Encounter: Payer: Self-pay | Admitting: Vascular Surgery

## 2017-06-10 ENCOUNTER — Ambulatory Visit: Payer: Medicare Other | Admitting: Vascular Surgery

## 2017-06-10 ENCOUNTER — Ambulatory Visit (HOSPITAL_COMMUNITY)
Admission: RE | Admit: 2017-06-10 | Discharge: 2017-06-10 | Disposition: A | Discharge status: Home / Self Care | Payer: Medicare Other | Source: Ambulatory Visit | Attending: Vascular Surgery | Admitting: Vascular Surgery

## 2017-06-10 ENCOUNTER — Ambulatory Visit (INDEPENDENT_AMBULATORY_CARE_PROVIDER_SITE_OTHER)
Admission: RE | Admit: 2017-06-10 | Discharge: 2017-06-10 | Disposition: A | Discharge status: Home / Self Care | Payer: Medicare Other | Source: Ambulatory Visit | Attending: Vascular Surgery | Admitting: Vascular Surgery

## 2017-06-10 DIAGNOSIS — R0989 Other specified symptoms and signs involving the circulatory and respiratory systems: Secondary | ICD-10-CM | POA: Insufficient documentation

## 2017-06-10 DIAGNOSIS — I724 Aneurysm of artery of lower extremity: Secondary | ICD-10-CM

## 2017-06-10 DIAGNOSIS — E785 Hyperlipidemia, unspecified: Secondary | ICD-10-CM | POA: Insufficient documentation

## 2017-06-10 DIAGNOSIS — I10 Essential (primary) hypertension: Secondary | ICD-10-CM | POA: Insufficient documentation

## 2017-06-10 DIAGNOSIS — Z87891 Personal history of nicotine dependence: Secondary | ICD-10-CM | POA: Insufficient documentation

## 2017-06-23 ENCOUNTER — Encounter: Payer: Self-pay | Admitting: Vascular Surgery

## 2017-06-24 ENCOUNTER — Ambulatory Visit (INDEPENDENT_AMBULATORY_CARE_PROVIDER_SITE_OTHER): Payer: Medicare Other | Admitting: Vascular Surgery

## 2017-06-24 ENCOUNTER — Encounter: Payer: Self-pay | Admitting: Vascular Surgery

## 2017-06-24 VITALS — BP 133/76 | HR 76 | Temp 98.0°F | Resp 16 | Ht 70.0 in | Wt 169.0 lb

## 2017-06-24 DIAGNOSIS — I724 Aneurysm of artery of lower extremity: Secondary | ICD-10-CM | POA: Diagnosis not present

## 2017-06-24 NOTE — Progress Notes (Signed)
Patient is a 71 year old male who returns for follow-up today.  He underwent repair of a right popliteal aneurysm for acute ischemia April 2017. He denies any claudication or rest pain symptoms. He still has some trace edema in the right leg becomes worse when he stands for long periods but otherwise he is not really bothered by this. He has no symptoms in the left leg. Previous CT angiogram showed no abdominal aortic aneurysm or left leg aneurysm. 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  Review of systems: He denies chest pain. He denies shortness of breath.  Physical exam:  Vitals:   06/24/17 1128  BP: 133/76  Pulse: 76  Resp: 16  Temp: 98 F (36.7 C)  TempSrc: Oral  SpO2: 98%  Weight: 169 lb (76.7 kg)  Height: 5\' 10"  (1.778 m)    Extremities: 2+ left posterior tibial pulse absent pedal pulses right foot both feet pink warm symmetric trace edema right leg   data: Patient had bilateral ABIs performed July 26. which were greater than 1 bilaterally. He also had a duplex ultrasound of his bypass graft which showed this is widely patent with no narrowing. I reviewed both these studies today.  Assessment: Doing well status post repair of right popliteal aneurysm repair.  Plan: The patient will follow-up in one year with repeat graft duplex and ABIs. He will see our nurse practitioner that office visit.  Ruta Hinds, MD [facility] Office: [phone] Pager: [phone]

## 2017-06-25 NOTE — Addendum Note (Signed)
Addended by: Lianne Cure A on: 06/25/2017 04:49 PM   Modules accepted: Orders

## 2017-08-03 ENCOUNTER — Other Ambulatory Visit: Payer: Self-pay | Admitting: Family Medicine

## 2017-08-03 DIAGNOSIS — E785 Hyperlipidemia, unspecified: Secondary | ICD-10-CM

## 2017-11-09 ENCOUNTER — Emergency Department (HOSPITAL_COMMUNITY): Payer: Medicare Other

## 2017-11-09 ENCOUNTER — Other Ambulatory Visit: Payer: Self-pay

## 2017-11-09 ENCOUNTER — Encounter (HOSPITAL_COMMUNITY): Payer: Self-pay | Admitting: Emergency Medicine

## 2017-11-09 DIAGNOSIS — I10 Essential (primary) hypertension: Secondary | ICD-10-CM | POA: Insufficient documentation

## 2017-11-09 DIAGNOSIS — Z79899 Other long term (current) drug therapy: Secondary | ICD-10-CM | POA: Insufficient documentation

## 2017-11-09 DIAGNOSIS — R0601 Orthopnea: Secondary | ICD-10-CM | POA: Diagnosis not present

## 2017-11-09 DIAGNOSIS — Z7982 Long term (current) use of aspirin: Secondary | ICD-10-CM | POA: Diagnosis not present

## 2017-11-09 DIAGNOSIS — R05 Cough: Secondary | ICD-10-CM | POA: Insufficient documentation

## 2017-11-09 DIAGNOSIS — R079 Chest pain, unspecified: Secondary | ICD-10-CM | POA: Diagnosis not present

## 2017-11-09 DIAGNOSIS — R0602 Shortness of breath: Secondary | ICD-10-CM | POA: Diagnosis not present

## 2017-11-09 DIAGNOSIS — Z87891 Personal history of nicotine dependence: Secondary | ICD-10-CM | POA: Diagnosis not present

## 2017-11-09 LAB — BASIC METABOLIC PANEL
Anion gap: 10 (ref 5–15)
BUN: 23 mg/dL — ABNORMAL HIGH (ref 6–20)
CO2: 22 mmol/L (ref 22–32)
Calcium: 9.7 mg/dL (ref 8.9–10.3)
Chloride: 107 mmol/L (ref 101–111)
Creatinine, Ser: 1.05 mg/dL (ref 0.61–1.24)
GFR calc Af Amer: >60 mL/min (ref >60)
GFR calc non Af Amer: >60 mL/min (ref >60)
Glucose, Bld: 104 mg/dL — ABNORMAL HIGH (ref 65–99)
Potassium: 3.5 mmol/L (ref 3.5–5.1)
Sodium: 139 mmol/L (ref 135–145)

## 2017-11-09 LAB — I-STAT TROPONIN, ED: Troponin i, poc: 0 ng/mL (ref 0.00–0.08)

## 2017-11-09 LAB — CBC
HCT: 41 % (ref 39.0–52.0)
Hemoglobin: 14.3 g/dL (ref 13.0–17.0)
MCH: 32 pg (ref 26.0–34.0)
MCHC: 34.9 g/dL (ref 30.0–36.0)
MCV: 91.7 fL (ref 78.0–100.0)
PLATELETS: 288 10*3/uL (ref 150–400)
RBC: 4.47 MIL/uL (ref 4.22–5.81)
RDW: 13 % (ref 11.5–15.5)
WBC: 11.7 10*3/uL — AB (ref 4.0–10.5)

## 2017-11-09 LAB — D-DIMER, QUANTITATIVE (NOT AT ARMC): D DIMER QUANT: 0.42 ug{FEU}/mL (ref 0.00–0.50)

## 2017-11-09 NOTE — ED Triage Notes (Signed)
Reports coming home from [facility] yesterday.  C/o sob worse when lying down for the last two weeks.  Seen by a physician prior to coming home.  Also endorses a dry non productive cough and that lungs are "burning".

## 2017-11-10 ENCOUNTER — Emergency Department (HOSPITAL_COMMUNITY)
Admission: EM | Admit: 2017-11-10 | Discharge: 2017-11-10 | Disposition: A | Discharge status: Home / Self Care | Payer: Medicare Other | Attending: Emergency Medicine | Admitting: Emergency Medicine

## 2017-11-10 DIAGNOSIS — R0601 Orthopnea: Secondary | ICD-10-CM

## 2017-11-10 NOTE — ED Provider Notes (Signed)
[facility] EMERGENCY DEPARTMENT Provider Note   CSN: [id number] Arrival date & time: 11/09/17  2143     History   Chief Complaint Chief Complaint  Patient presents with  . Shortness of Breath    HPI Mike Wong is a 71 y.o. male.  The history is provided by the patient.  Shortness of Breath  This is a new problem. The average episode lasts 2 weeks. The problem occurs intermittently.The problem has not changed since onset.Associated symptoms include cough. Pertinent negatives include no fever, no headaches, no sore throat, no sputum production, no hemoptysis, no chest pain, no syncope, no vomiting, no abdominal pain and no leg swelling. Risk factors include recent prolonged sitting. He has tried nothing for the symptoms. Associated medical issues do not include asthma, COPD, PE, CAD or heart failure.  Patient reports onset of shortness of breath over 2 weeks ago. He reports that he recently was in [place] and [place] for the past 4 weeks He reports after 2 weeks in [place], he began feeling shortness of breath, mostly at night while lying flat It seems to improve during the day. He thought it might be due to elevation He denies fever,  denies hemoptysis, denies chills No chest pain is reported on my exam He was seen by physician in [place] He flew back from [place] to [place] today still feels short of breath at night He denies dyspnea on exertion Past Medical History:  Diagnosis Date  . Aneurysm artery, popliteal ([place]) 02/2016   RT LEG   . Arthritis   . Clotting disorder (HCC)    leg artery aneurysm   . Finger fracture, right    5th finger  . High cholesterol   . Hypertension   . Kidney stones     Patient Active Problem List   Diagnosis Date Noted  . Popliteal artery aneurysm ([place]) 02/25/2016  . History of colonic polyps 03/13/2014  . Hyperlipidemia with target LDL less than 130 03/13/2014  . History of vitamin D deficiency  03/13/2014  . BPH (benign prostatic hyperplasia) 03/13/2014  . History of renal stone 03/13/2014  . Hypertension 10/20/2012    Past Surgical History:  Procedure Laterality Date  . BYPASS GRAFT POPLITEAL TO POPLITEAL Right 03/03/2016   Procedure: BYPASS RIGHT ABOVE POPLITEAL TO BELOW POPLITEAL USING REVERSED RIGHT GREATER SAPHENOUS VEIN;  Surgeon: Elam Dutch, MD;  Location: [facility];  Service: Vascular;  Laterality: Right;  . COLONOSCOPY  11/04/06  . ELECTROCARDIOGRAM  03/16/03  . EMBOLECTOMY Right 03/03/2016   Procedure: RIGHT ANTERIOR TIBIAL EMBOLECTOMY;  Surgeon: Elam Dutch, MD;  Location: [facility];  Service: Vascular;  Laterality: Right;  . FALSE ANEURYSM REPAIR Right 03/03/2016   Procedure: LIGATION RIGHT  POPLITEAL ARTERY ANEURYSM;  Surgeon: Elam Dutch, MD;  Location: [facility];  Service: Vascular;  Laterality: Right;  . LITHOTRIPSY    . LITHOTRIPSY    . OPEN REDUCTION INTERNAL FIXATION (ORIF) METACARPAL Right 10/08/2015   Procedure: OPEN REDUCTION INTERNAL FIXATION (ORIF) METACARPAL RIGHT RING FINGER;  Surgeon: Daryll Brod, MD;  Location: [facility];  Service: Orthopedics;  Laterality: Right;       Home Medications    Prior to Admission medications   Medication Sig Start Date End Date Taking? Authorizing Provider  amLODipine (NORVASC) 10 MG tablet Take 1 tablet (10 mg total) by mouth daily. 03/22/17  Yes [name], MD  aspirin 81 MG chewable tablet Chew 81 mg by mouth daily.  Yes [provider]  atorvastatin (LIPITOR) 10 MG tablet Take 1 tablet (10 mg total) by mouth daily. 03/22/17  Yes [name], MD  omega-3 acid ethyl esters (LOVAZA) 1 g capsule Take 1 g by mouth daily.   Yes [provider]    Family History Family History  Problem Relation Age of Onset  . Colon cancer Neg Hx   . Colon polyps Neg Hx     Social History Social History   Tobacco Use  . Smoking status: Former Research scientist (life sciences)  . Smokeless tobacco: Never Used    . Tobacco comment: QUIT SMOKING IN 1982  Substance Use Topics  . Alcohol use: Yes    Alcohol/week: 0.6 oz    Types: 1 Cans of beer per week    Comment: or less  . Drug use: No     Allergies   Patient has no known allergies.   Review of Systems Review of Systems  Constitutional: Negative for chills and fever.  HENT: Negative for sore throat.   Respiratory: Positive for cough and shortness of breath. Negative for hemoptysis and sputum production.   Cardiovascular: Negative for chest pain, leg swelling and syncope.  Gastrointestinal: Negative for abdominal pain and vomiting.  Neurological: Negative for headaches.  All other systems reviewed and are negative.    Physical Exam Updated Vital Signs BP (!) 125/91   Pulse (!) 58   Temp 98 F (36.7 C) (Oral)   Resp 16   Ht 1.778 m (5\' 10" )   Wt 77.1 kg (170 lb)   SpO2 96%   BMI 24.39 kg/m   Physical Exam CONSTITUTIONAL: Well developed/well nourished HEAD: Normocephalic/atraumatic EYES: EOMI/PERRL ENMT: Mucous membranes moist NECK: supple no meningeal signs, no JVD SPINE/BACK:entire spine nontender CV: S1/S2 noted, no murmurs/rubs/gallops noted LUNGS: Lungs are clear to auscultation bilaterally, no apparent distress ABDOMEN: soft, nontender, no rebound or guarding, bowel sounds noted throughout abdomen GU:no cva tenderness NEURO: Pt is awake/alert/appropriate, moves all extremitiesx4.  No facial droop.   EXTREMITIES: pulses normal/equal, full ROM, no lower extremity edema or calf tenderness SKIN: warm, color normal PSYCH: no abnormalities of mood noted, alert and oriented to situation   ED Treatments / Results  Labs (all labs ordered are listed, but only abnormal results are displayed) Labs Reviewed  BASIC METABOLIC PANEL - Abnormal; Notable for the following components:      Result Value   Glucose, Bld 104 (*)    BUN 23 (*)    All other components within normal limits  CBC - Abnormal; Notable for the following  components:   WBC 11.7 (*)    All other components within normal limits  D-DIMER, QUANTITATIVE (NOT AT [facility])  I-STAT TROPONIN, ED    EKG  EKG Interpretation  Date/Time:  Tuesday November 09 2017 21:54:50 EST Ventricular Rate:  72 PR Interval:  144 QRS Duration: 82 QT Interval:  424 QTC Calculation: 464 R Axis:   3 Text Interpretation:  Normal sinus rhythm Moderate voltage criteria for LVH, may be normal variant Nonspecific ST and T wave abnormality Abnormal ECG Interpretation limited secondary to artifact Otherwise no significant change Confirmed by Ripley Fraise [phone]) on 11/10/2017 12:59:42 AM       Radiology Dg Chest 2 View  Result Date: 11/09/2017 CLINICAL DATA:  Left-sided chest pain and shortness of breath for 1 month EXAM: CHEST  2 VIEW COMPARISON:  None. FINDINGS: The heart size and mediastinal contours are within normal limits. Both lungs are clear. Mild scoliosis of  the spine. IMPRESSION: No active cardiopulmonary disease. Electronically Signed   By: Donavan Foil M.D.   On: 11/09/2017 22:47    Procedures Procedures (including critical care time)  Medications Ordered in ED Medications - No data to display   Initial Impression / Assessment and Plan / ED Course  I have reviewed the triage vital signs and the nursing notes.  Pertinent labs & imaging results that were available during my care of the patient were reviewed by me and considered in my medical decision making (see chart for details).     Well-appearing, chest x-ray negative, EKG unchanged, no signs of CHF. PE has been ruled out He ambulated without any difficulty  I had him lie flat during my exam and he had no symptoms At this point I feel he is safe and stable for discharge home We will have him follow-up with further evaluation as an outpatient, may need a sleep study. No signs of infectious etiology. I feel ACS, CHF, PE have been safely ruled out  Final Clinical Impressions(s) / ED  Diagnoses   Final diagnoses:  Central    ED Discharge Orders    None       Ripley Fraise, MD 11/10/17 [phone]

## 2017-11-10 NOTE — ED Notes (Signed)
Pt ambulated through hallway on pulse oximetry, stayed between 97-99% on room air. Tolerated well.

## 2017-11-12 ENCOUNTER — Other Ambulatory Visit: Payer: Self-pay

## 2017-11-12 ENCOUNTER — Ambulatory Visit (HOSPITAL_BASED_OUTPATIENT_CLINIC_OR_DEPARTMENT_OTHER): Payer: Medicare Other

## 2017-11-12 ENCOUNTER — Ambulatory Visit (INDEPENDENT_AMBULATORY_CARE_PROVIDER_SITE_OTHER): Payer: Medicare Other | Admitting: Family Medicine

## 2017-11-12 ENCOUNTER — Encounter (HOSPITAL_COMMUNITY): Payer: Self-pay | Admitting: Emergency Medicine

## 2017-11-12 ENCOUNTER — Emergency Department (HOSPITAL_COMMUNITY): Payer: Medicare Other

## 2017-11-12 ENCOUNTER — Encounter: Payer: Self-pay | Admitting: Family Medicine

## 2017-11-12 VITALS — BP 150/90 | HR 81 | Resp 20 | Wt 166.6 lb

## 2017-11-12 DIAGNOSIS — R0609 Other forms of dyspnea: Secondary | ICD-10-CM

## 2017-11-12 DIAGNOSIS — R0602 Shortness of breath: Secondary | ICD-10-CM | POA: Diagnosis not present

## 2017-11-12 DIAGNOSIS — R0600 Dyspnea, unspecified: Secondary | ICD-10-CM

## 2017-11-12 DIAGNOSIS — Q210 Ventricular septal defect: Secondary | ICD-10-CM

## 2017-11-12 DIAGNOSIS — R9431 Abnormal electrocardiogram [ECG] [EKG]: Secondary | ICD-10-CM | POA: Insufficient documentation

## 2017-11-12 DIAGNOSIS — I728 Aneurysm of other specified arteries: Secondary | ICD-10-CM

## 2017-11-12 DIAGNOSIS — I088 Other rheumatic multiple valve diseases: Secondary | ICD-10-CM

## 2017-11-12 DIAGNOSIS — Z5321 Procedure and treatment not carried out due to patient leaving prior to being seen by health care provider: Secondary | ICD-10-CM | POA: Insufficient documentation

## 2017-11-12 LAB — ECHOCARDIOGRAM COMPLETE: Weight: 2665.6 oz

## 2017-11-12 LAB — BRAIN NATRIURETIC PEPTIDE: BRAIN NATRIURETIC PEPTIDE: 17 pg/mL (ref <100)

## 2017-11-12 IMAGING — XA IR INFUSION THROMBOL ARTERIAL INITIAL (MS)
1 series · 13 of 24 positions shown · IV contrast (IODINE)
Comparison: none

INDICATION: Right popliteal artery aneurysm with distal right peroneal
thromboemboli and right lower extremity acute ischemia

[Series 300: ir declot thromb agent implant vasc devi · 13 of 67 slices shown]
[im 1/67]
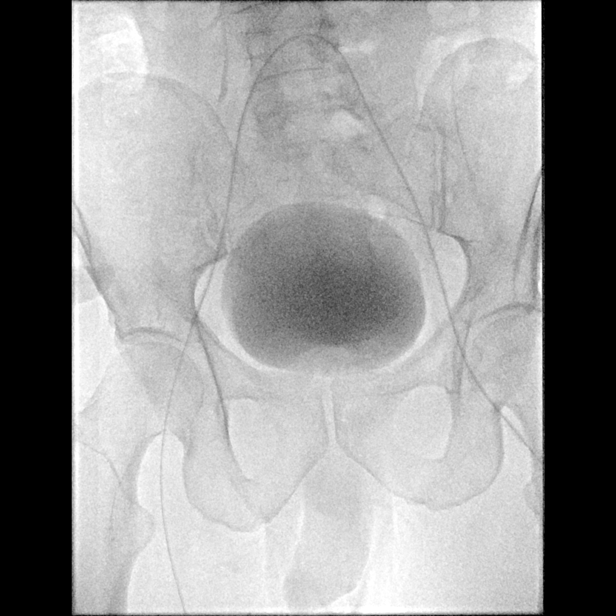
[im 6/67]
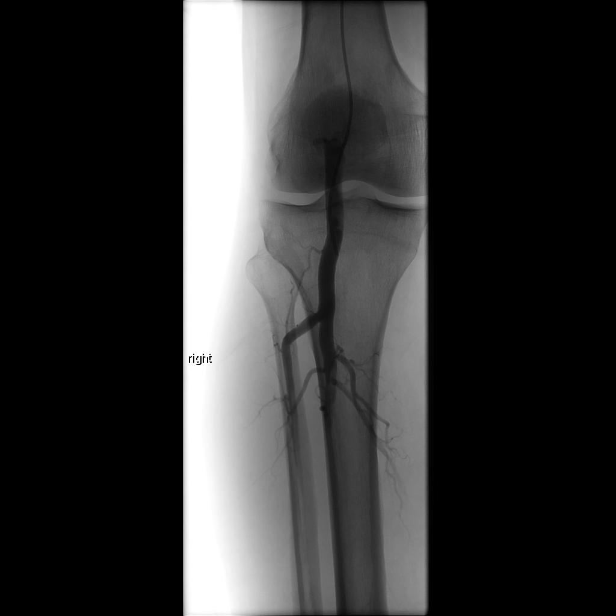
[im 12/67]
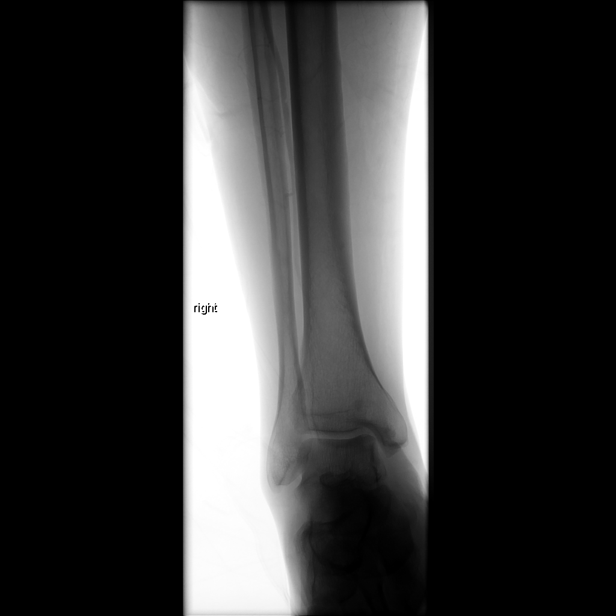
[im 18/67]
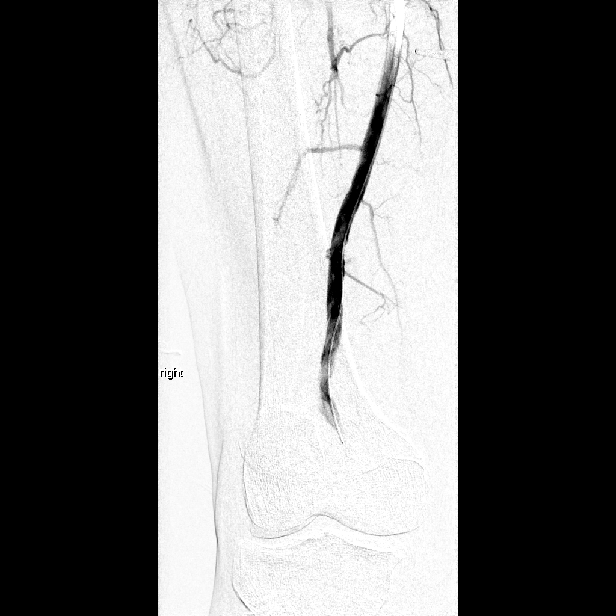
[im 23/67]
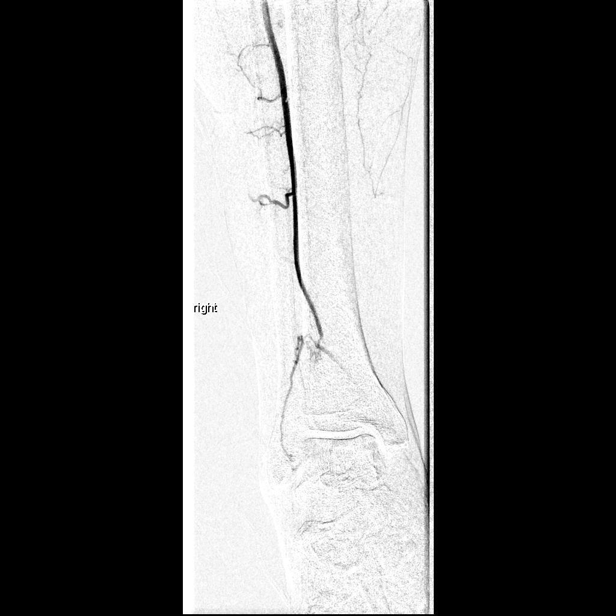
[im 29/67]
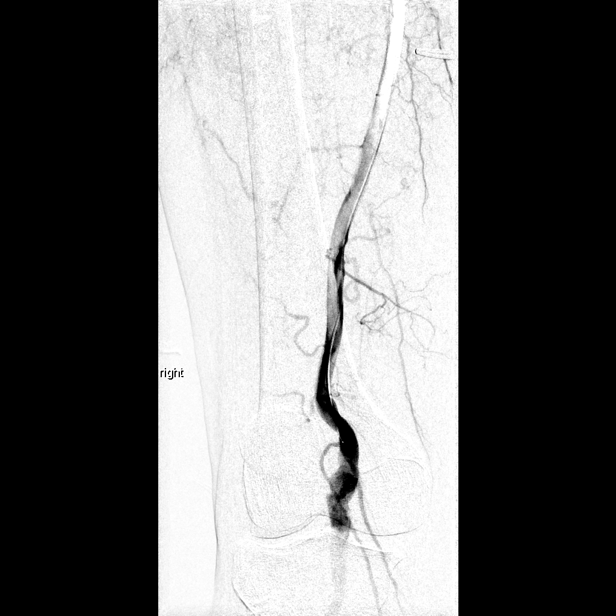
[im 35/67]
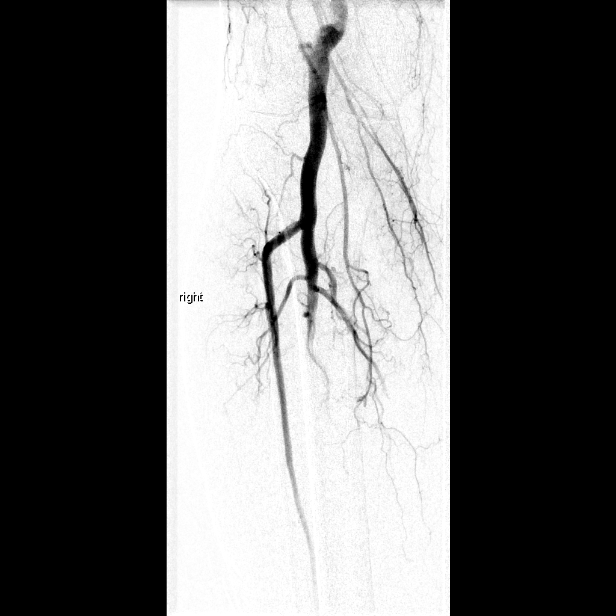
[im 38/67]
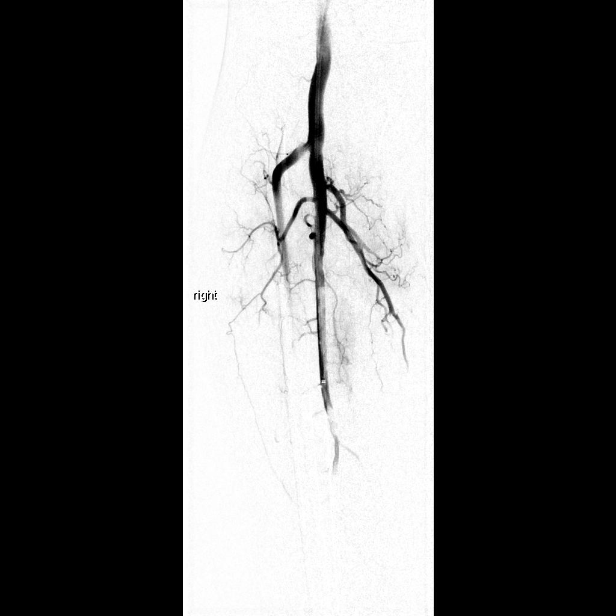
[im 44/67]
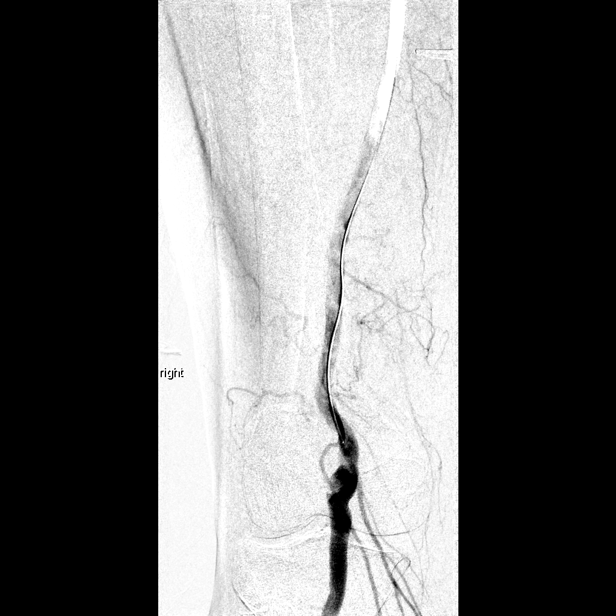
[im 49/67]
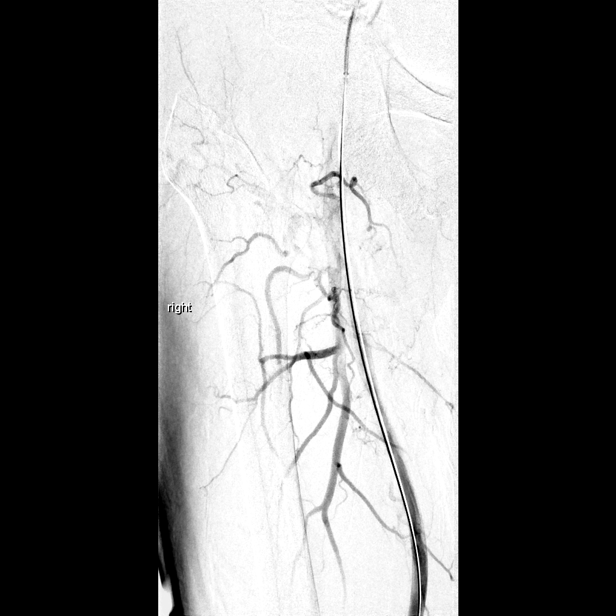
[im 55/67]
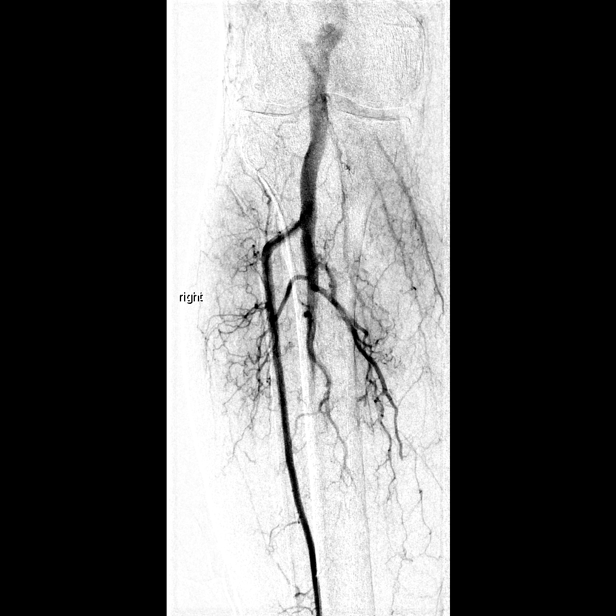
[im 61/67]
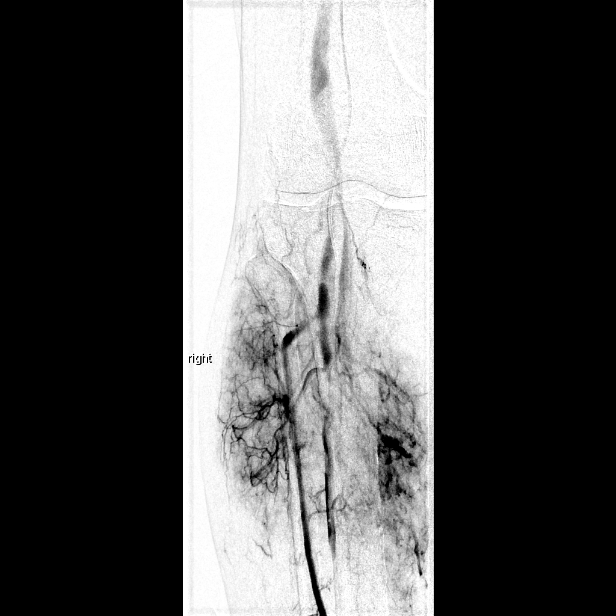
[im 67/67]
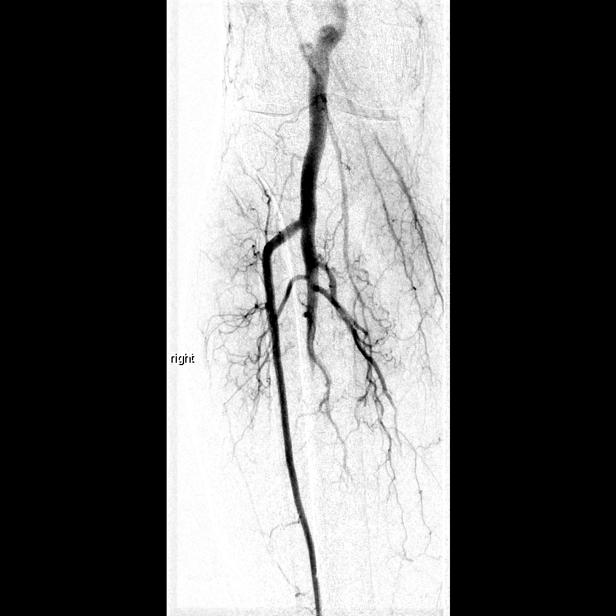

[13 of 24 positions shown; findings below may reference images not displayed]

EXAM:
IR INFUSION THROMBOL ARTERIAL INITIAL (MS); IR ULTRASOUND GUIDANCE
VASC ACCESS LEFT; RIGHT EXTREMITY ARTERIOGRAPHY

MEDICATIONS:
1% lidocaine locally.

ANESTHESIA/SEDATION:
Moderate (conscious) sedation was employed during this procedure. A
total of Versed 1.5 mg and Fentanyl 100 mcg was administered
intravenously.

Moderate Sedation Time: 45 minutes. The patient's level of
consciousness and vital signs were monitored continuously by
radiology nursing throughout the procedure under my direct
supervision.

CONTRAST:  100 PO788P-322 IOPAMIDOL (PO788P-322) INJECTION 61%

FLUOROSCOPY TIME:  Fluoroscopy Time: 14 minutes 0 seconds (43 mGy).

COMPLICATIONS:
None immediate.



Under sterile conditions and local anesthesia, ultrasound
micropuncture access performed of the patent left common femoral
artery. Initially a short 6 French sheath was inserted over
guidewire. A SOS catheter was utilized to select the iliac
bifurcation. Contrast injection was performed to identify the right
iliac vessels. Exchanged length Glidewire was advanced peripherally
into the right SFA. Catheter was exchanged for a C2 catheter which
eventually advanced to level of the right common femoral artery.
This allowed further advancement of the stiff exchange Glidewire
into the right lower extremity. Over the stiff Glidewire, a 6 French
MPA destination sheath was advanced over the bifurcation and
positioned in the right common femoral artery. Right lower extremity
angiogram performed through the sheath over the guidewire.

Right lower extremity angiogram: Diffuse arteriomegaly of the right
iliac, femoral, and popliteal vessels. The right common, internal
and external iliac vessels are markedly tortuous but patent. The
right common femoral, profunda femoral, and superficial femoral
arteries are patent. The right popliteal artery is irregular and
tortuous with diffuse fusiform dilatation compatible with the known
right popliteal artery aneurysm. Filling defect within the popliteal
aneurysm evident compatible with residual thrombus adherent to the
aneurysm wall. Below-knee right popliteal artery is patent. The
right anterior tibial artery is patent to just above the ankle.
Small emboli suspected in the distal right anterior tibial artery
above the ankle. Small collateral vessels noted. The peroneal artery
tapers proximally to thrombotic occlusion in the mid calf level.
Diffuse thrombotic occlusion noted of the native peroneal artery.

Rosen guidewire advanced into the thrombosed right peroneal artery.
This allowed exchange for a 135 cm length infusion catheter with a
40 cm infusion length. Contrast injection of the infusion catheter
reconfirms position within the thrombosed peroneal artery below the
popliteal aneurysm. Images obtained for documentation.

Access secured externally. Transcatheter thrombolysis will begin
through the infusion catheter with YOEL at 0.5 milligrams/hour.
IMPRESSION: Right lower extremity catheter angiogram confirms a partially
thrombosed right popliteal artery aneurysm with thromboemboli into
the right peroneal artery proximally and in the right anterior
tibial artery distally.

Infusion catheter access obtained into the thrombosed peroneal
artery to begin transcatheter thrombolysis at 0.5 mg YOEL per hour.

Follow-up angiogram will be performed after approximate 12 hours of
infusion.

## 2017-11-12 IMAGING — XA IR THROMB F/U EVAL ART/VEN SUBSEQ DAY (MS)
3 series · 14 of 16 positions shown · IV contrast (IODINE)
Comparison: none

INDICATION: 70-year-old male with right popliteal artery aneurysm complicated by
distal embolization and right foot ischemia. He is currently
undergoing arterial thrombolysis which began at 3 a.m. this morning.
He presents for his initial check after approximately 14 hours of
lysis.

[Series 1: body 4 care · 8 of 9 slices shown (1 of 2)]
[im 1/9]
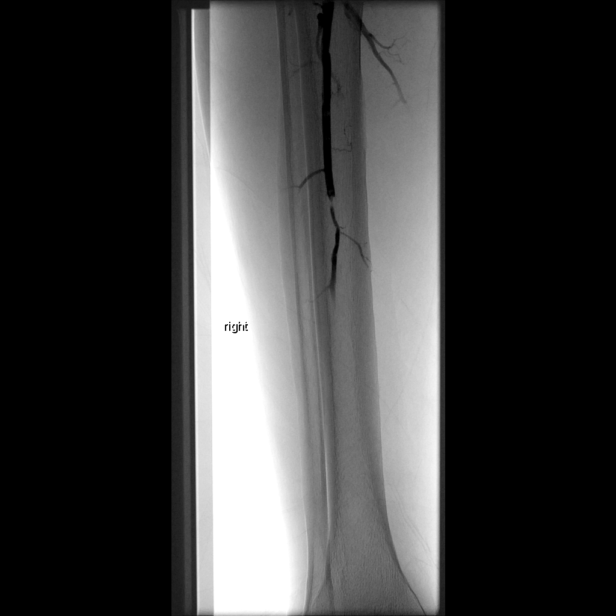
[im 2/9]
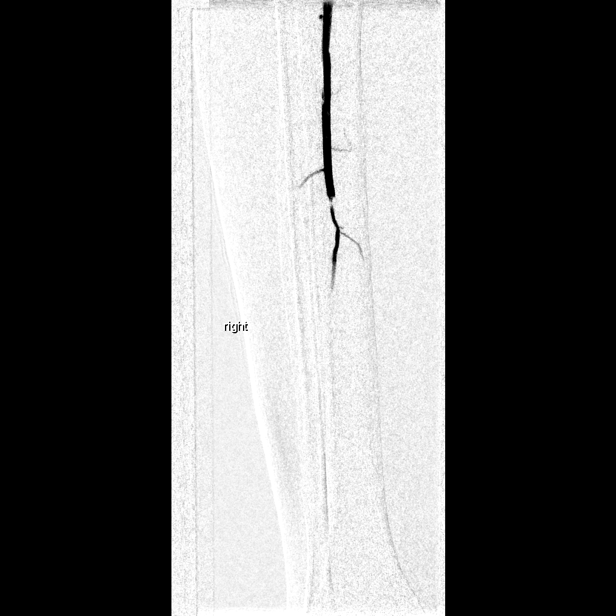
[im 3/9]
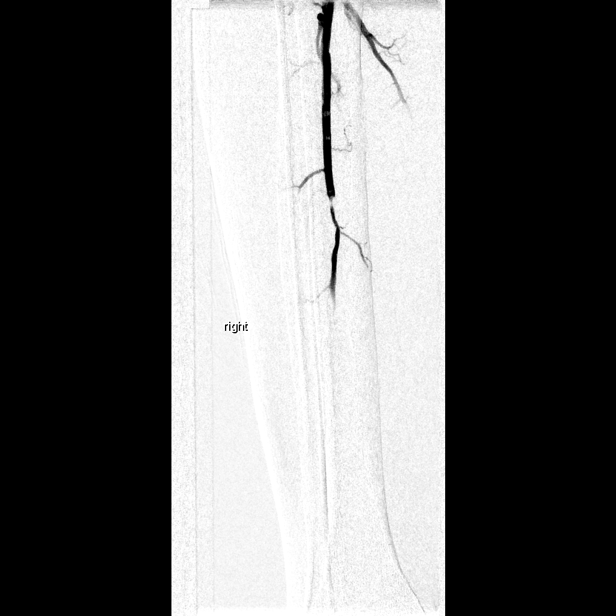
[im 5/9]
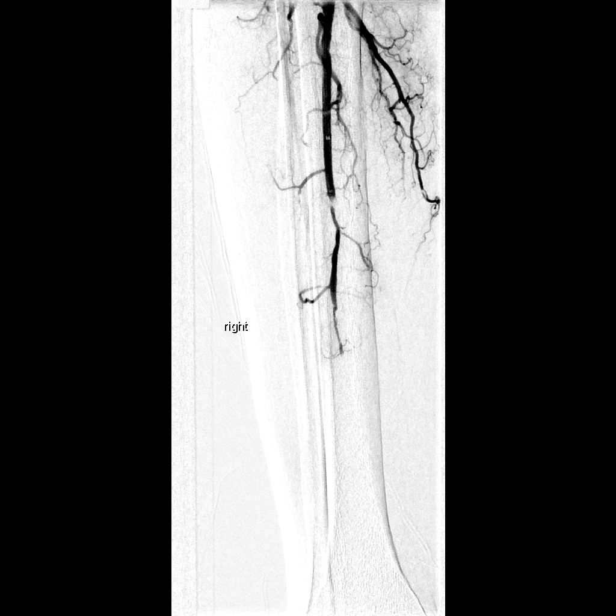
[im 6/9]
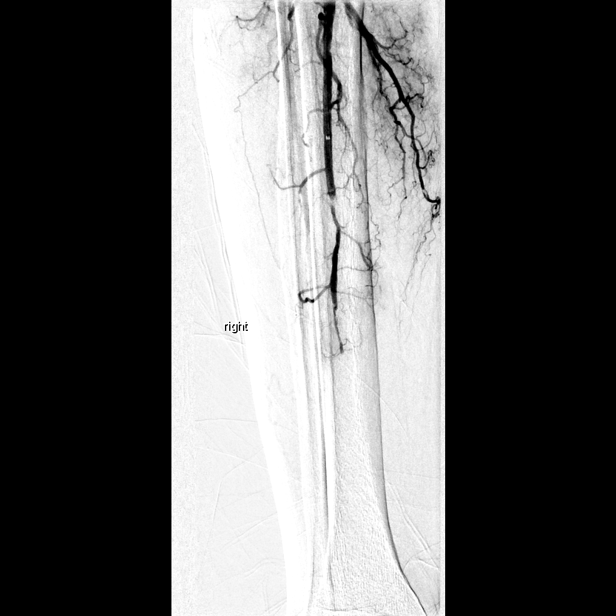
[im 7/9]
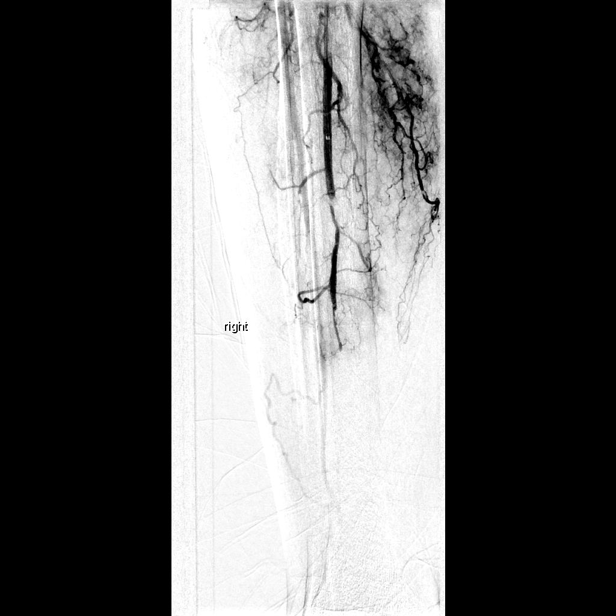
[im 8/9]
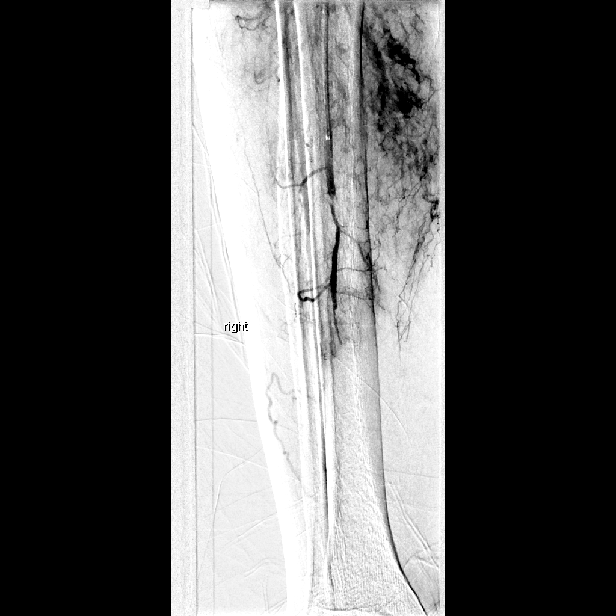
[im 9/9]
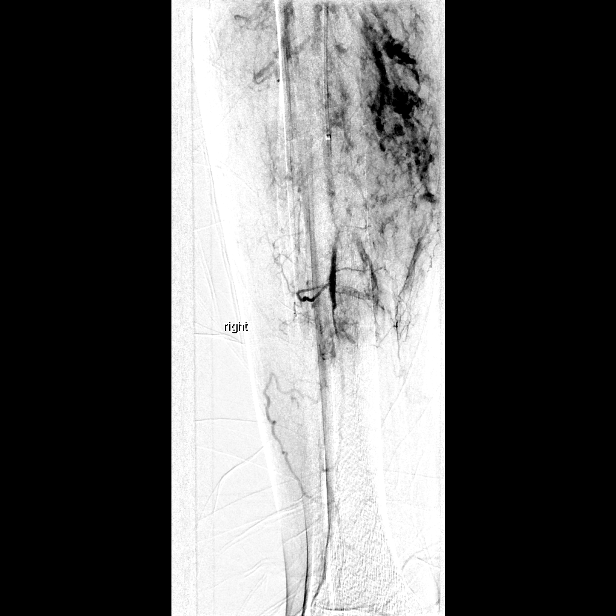

[Series 3: body 4 care · 5 of 6 slices shown (2 of 2)]
[im 1/6]
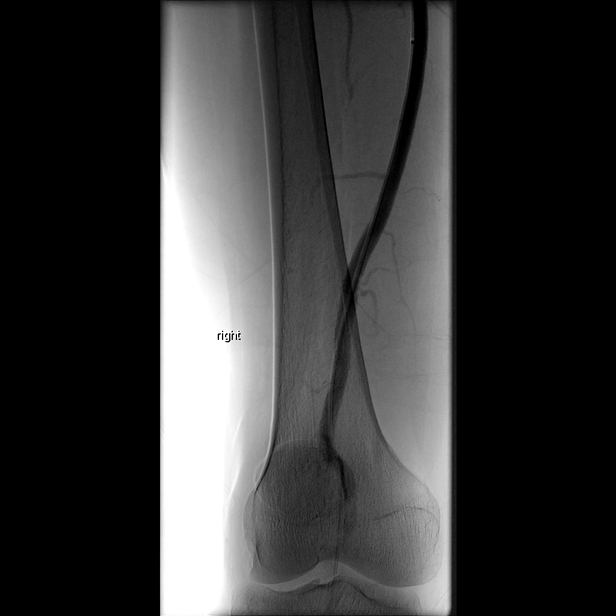
[im 2/6]
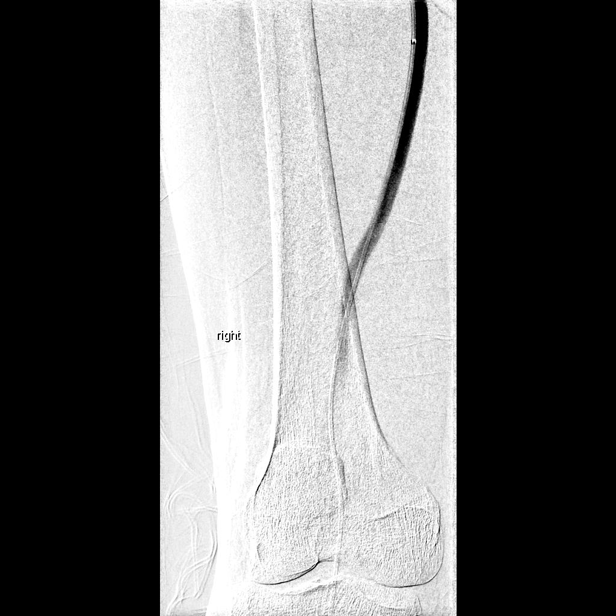
[im 4/6]
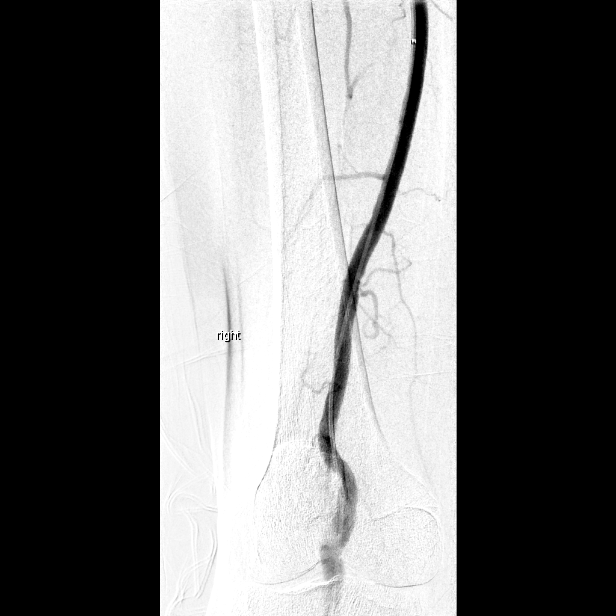
[im 5/6]
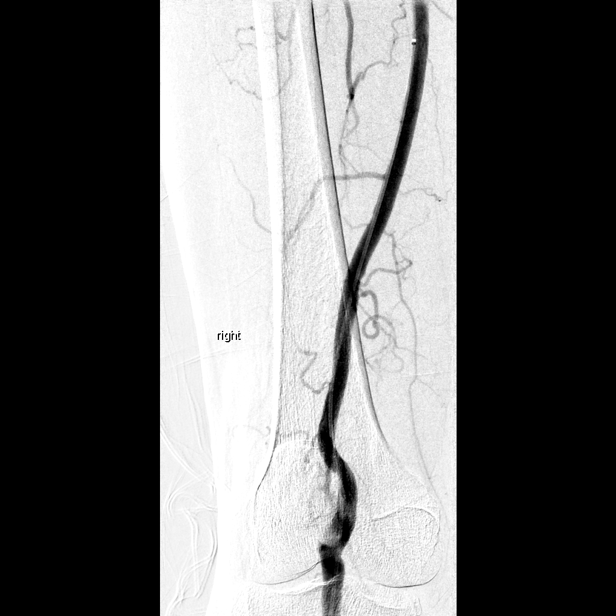
[im 6/6]
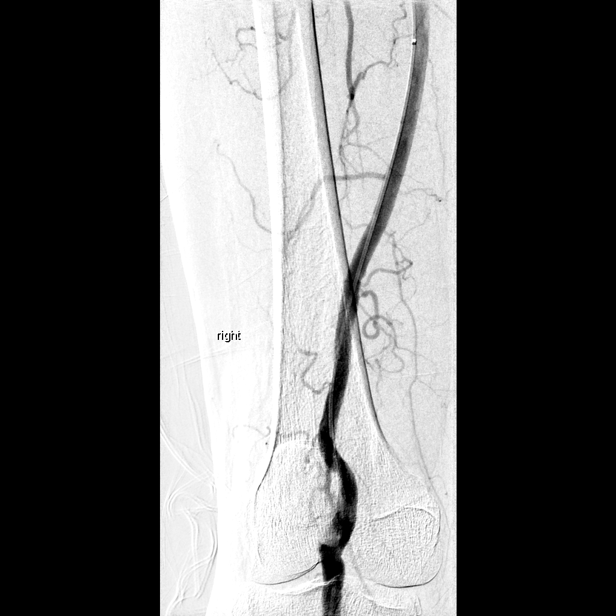

[Series 5: fl (-) angio · 1 of 1 slices shown]
[im 1/1]
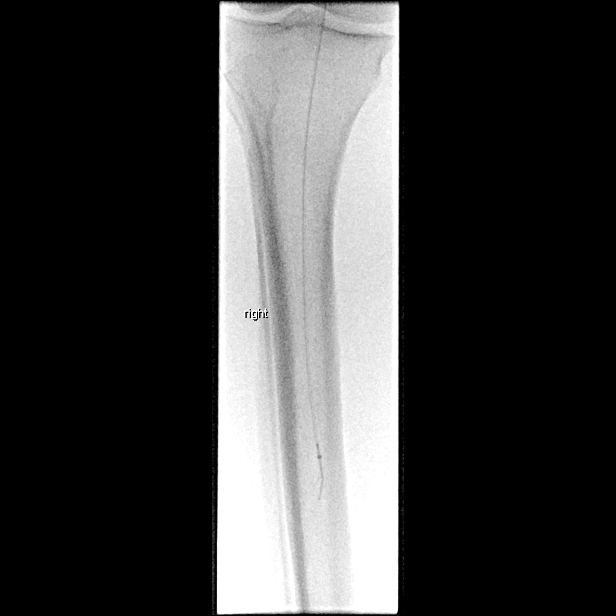

[14 of 16 positions shown; findings below may reference images not displayed]

EXAM:
IR THROMB F/U EVAL ING/HAGANA SUBSEQ DAY (MS)

MEDICATIONS:
None

ANESTHESIA/SEDATION:
None

CONTRAST:  50 mL Omnipaque 300

FLUOROSCOPY TIME:  Fluoroscopy Time: 7 minutes 42 seconds (10 mGy).

COMPLICATIONS:
None immediate.

Estimated blood loss: 0



The wire was removed from the lysis catheter. Right lower extremity
arteriography was then performed by contrast injection through the
lysis catheter. There is persistent thrombus in the hyper trophic
the peroneal vein with complete occlusion distally. No in-line
filling of the distal peroneal artery or continuation as the
posterior tibial artery.

Injection through the sheath proximally demonstrates persistent
irregularity along the popliteal aneurysm consistent with residual
thrombus. The anterior tibial artery remains patent.

A Glidewire was carefully advanced through the infusion catheter and
advanced beyond the region of obstruction. The infusion catheter was
then advanced distally into the occluded segment. A new occlusion
wire was advanced through the catheter device and thrombolysis was
re- knee she at the same rate as before (0.5 milligram/hour). New
sterile bandages were applied.
IMPRESSION: Persistent occlusive thrombus in the peroneal artery.

Persistent irregular but nonocclusive thrombus along the lateral
wall of the popliteal artery aneurysm.

Thrombolysis catheter advanced distally into the occluded segment of
the peroneal artery and lysis continued.

## 2017-11-12 NOTE — ED Triage Notes (Signed)
Seen here a week ago for the same.  Reports SOB that is worse with laying down.  Had echo today.  Breathing easy and nonlabored in triage.

## 2017-11-12 NOTE — Progress Notes (Signed)
   Subjective:    Patient ID: Mike Wong, male    DOB: 07-Jul-1946, 71 y.o.   MRN: [id number]  HPI    He is here for consultation concerning difficulty with shortness of breath.  He was recently on a vacation in [place] visiting [place] and [place].  While over there he did have difficulty with the altitude and did become short of breath.  He apparently was seen in a hospital over there.  He came back to [place] and was seen on December 26 in the emergency room.  That record was reviewed.  He was evaluated with x-ray, EKG, blood work including troponins and d-dimer all of which was negative.  He continues to complain of shortness of breath.  He notes this especially if he lies down at night or goes up a flight of steps that he becomes short of breath.  He is noted no chest pain, diaphoresis, peripheral edema symptoms.   Review of Systems     Objective:   Physical Exam Alert and in no distress.  He is breathing at roughly 20/min.  Pulse ox is recorded.  Lungs are clear to auscultation.  Cardiac exam shows regular rhythm without murmurs or gallops.  Negative Homans sign.  Blood work and EKG as well as x-ray and from the ER was evaluated.       Assessment & Plan:  PND (paroxysmal nocturnal dyspnea) - Plan: Brain natriuretic peptide, ECHOCARDIOGRAM COMPLETE  DOE (dyspnea on exertion) - Plan: Brain natriuretic peptide, ECHOCARDIOGRAM COMPLETE At this point I will pursue more cardiac origin than pulmonary.

## 2017-11-13 ENCOUNTER — Emergency Department (HOSPITAL_COMMUNITY)
Admission: EM | Admit: 2017-11-13 | Discharge: 2017-11-13 | Disposition: A | Discharge status: Home / Self Care | Payer: Medicare Other | Attending: Emergency Medicine | Admitting: Emergency Medicine

## 2017-11-13 DIAGNOSIS — R069 Unspecified abnormalities of breathing: Secondary | ICD-10-CM | POA: Diagnosis not present

## 2017-11-13 NOTE — ED Notes (Signed)
Pt states that he is leaving due to wait times  

## 2017-11-15 ENCOUNTER — Encounter: Payer: Self-pay | Admitting: Medical

## 2017-11-15 ENCOUNTER — Other Ambulatory Visit: Payer: Self-pay

## 2017-11-15 ENCOUNTER — Ambulatory Visit (INDEPENDENT_AMBULATORY_CARE_PROVIDER_SITE_OTHER): Payer: Medicare Other | Admitting: Medical

## 2017-11-15 VITALS — BP 150/70 | HR 81 | Resp 16 | Wt 160.8 lb

## 2017-11-15 DIAGNOSIS — R0602 Shortness of breath: Secondary | ICD-10-CM

## 2017-11-15 DIAGNOSIS — R0609 Other forms of dyspnea: Secondary | ICD-10-CM | POA: Diagnosis not present

## 2017-11-15 DIAGNOSIS — R350 Frequency of micturition: Secondary | ICD-10-CM

## 2017-11-15 DIAGNOSIS — R634 Abnormal weight loss: Secondary | ICD-10-CM | POA: Diagnosis not present

## 2017-11-15 LAB — POCT URINALYSIS DIP (PROADVANTAGE DEVICE)
BILIRUBIN UA: NEGATIVE
BILIRUBIN UA: NEGATIVE mg/dL
Blood, UA: NEGATIVE
Glucose, UA: NEGATIVE mg/dL
LEUKOCYTES UA: NEGATIVE
Nitrite, UA: NEGATIVE
Protein Ur, POC: NEGATIVE mg/dL
Specific Gravity, Urine: 1.015
Urobilinogen, Ur: NEGATIVE
pH, UA: 6 (ref 5.0–8.0)

## 2017-11-15 MED ORDER — ALBUTEROL SULFATE HFA 108 (90 BASE) MCG/ACT IN AERS: 2.0000 | INHALATION_SPRAY | Freq: Four times a day (QID) | RESPIRATORY_TRACT | 0 refills | Status: DC | PRN

## 2017-11-15 MED ORDER — PREDNISONE 20 MG PO TABS: ORAL_TABLET | ORAL | 0 refills | Status: DC

## 2017-11-15 MED ORDER — ALBUTEROL SULFATE (2.5 MG/3ML) 0.083% IN NEBU
2.5000 mg | INHALATION_SOLUTION | Freq: Once | RESPIRATORY_TRACT | Status: AC
  Administered 2017-11-15: 2.5 mg via RESPIRATORY_TRACT

## 2017-11-15 NOTE — Progress Notes (Signed)
Subjective: Chief Complaint  Patient presents with  . Weight Loss    6 lbs in 3 days.   . Shortness of Breath   Here for recheck on shortness of breath and new weight loss.   He normally sees Dr. Redmond School here.  This is my first time seeing him.    He was seen recently on multiple occasions for similar symptoms.  He was out of the country in [place] visiting [place] and [place] recently.  He was at high altitude became short of breath and was seen in a hospital while there.  Reportedly nothing was found to be abnormal.  When he returned here in [place] went to the emergency department had several tests done but no specific abnormality found.  He saw Dr. Redmond School here on 11/12/17, ultimately had an echocardiogram, and then was advised to follow-up here today.    He has some shortness of breath throughout the day.  He notes ongoing shortness of breath worse when lying flat he denies any prior similar problem prior to his recent trip to [place].Marland Kitchen He is worried about a virus.    He denies diarrhea, fever, nausea, vomiting, cough, congestion, rash, swelling.  No calf pain.  In addition to his recent shortness of breath he notes losing 12 pounds unexpectedly over the last 3-4 weeks.  Denies blood in the urine or stool, no change in appetite.  He has been drinking a little bit more fluids, more thirsty than usual.  No other aggravating or relieving factors. No other complaint.  Past Medical History:  Diagnosis Date  . Aneurysm artery, popliteal ([facility]) 02/2016   RT LEG   . Arthritis   . Clotting disorder (HCC)    leg artery aneurysm   . Finger fracture, right    5th finger  . High cholesterol   . Hypertension   . Kidney stones    Current Outpatient Medications on File Prior to Visit  Medication Sig Dispense Refill  . amLODipine (NORVASC) 10 MG tablet Take 1 tablet (10 mg total) by mouth daily. 90 tablet 3  . aspirin 81 MG chewable tablet Chew 81 mg by mouth daily.    Marland Kitchen atorvastatin  (LIPITOR) 10 MG tablet Take 1 tablet (10 mg total) by mouth daily. 90 tablet 3  . omega-3 acid ethyl esters (LOVAZA) 1 g capsule Take 1 g by mouth daily.     No current facility-administered medications on file prior to visit.    ROS as in subjective   Objective: BP (!) 150/70   Pulse 81   Resp 16   Wt 160 lb 12.8 oz (72.9 kg)   SpO2 99%   BMI 23.07 kg/m   Wt Readings from Last 3 Encounters:  11/15/17 160 lb 12.8 oz (72.9 kg)  11/12/17 166 lb (75.3 kg)  11/12/17 166 lb 9.6 oz (75.6 kg)   General appearance: alert, no distress, WD/WN HEENT: normocephalic, sclerae anicteric, TMs pearly, nares patent, no discharge or erythema, pharynx normal Oral cavity: MMM, no lesions Neck: supple, no lymphadenopathy, no thyromegaly, no masses Heart: RRR, normal S1, S2, no murmurs Lungs: CTA bilaterally, no wheezes, rhonchi, or rales Abdomen: +bs, soft, non tender, non distended, no masses, no hepatomegaly, no splenomegaly Pulses: 2+ symmetric, upper and lower extremities, normal cap refill No edema Neuro: nonfocal exam    Assessment: Encounter Diagnoses  Name Primary?  . DOE (dyspnea on exertion)   . Urine frequency Yes  . SOB (shortness of breath)   . Weight  loss     Plan: We discussed the 2 issues, shortness of breath and weight loss.    Regarding weight loss, additional labs today, discussed potential additional evaluation when there is unexpected weight loss.  Recent weights in the chart show that he is only lost 6 pounds, however he was 172 pounds back in May of this year, which would put him around 12 pound weight loss.  He reports the weight loss is recent.  Pending labs consider CT scan chest abdomen pelvis  regarding shortness of breath, I reviewed his recent hospital evaluation and visit here, reviewed the recent echocardiogram, chest x-ray, EKG, labs.  His symptoms may just be related to high elevation and acute mountain sickness.    I will check a few additional labs  today.  His PFT was abnormal so we will begin a short round of prednisone and albuterol inhaler.  He did seem to get benefit with albuterol nebulized treatment today in office.  Follow-up pending labs.   Costa was seen today for weight loss and shortness of breath.  Diagnoses and all orders for this visit:  Urine frequency -     POCT Urinalysis DIP (Proadvantage Device)  DOE (dyspnea on exertion) -     Spirometry with graph -     TSH -     CBC with Differential/Platelet -     albuterol (PROVENTIL) (2.5 MG/3ML) 0.083% nebulizer solution 2.5 mg  SOB (shortness of breath) -     TSH -     CBC with Differential/Platelet -     albuterol (PROVENTIL) (2.5 MG/3ML) 0.083% nebulizer solution 2.5 mg  Weight loss -     TSH -     CBC with Differential/Platelet  Other orders -     Discontinue: albuterol (PROVENTIL HFA;VENTOLIN HFA) 108 (90 Base) MCG/ACT inhaler; Inhale 2 puffs into the lungs every 6 (six) hours as needed for wheezing or shortness of breath. -     Discontinue: predniSONE (DELTASONE) 20 MG tablet; 3 tablets today, 2 tablets tomorrow, 1 tablet the third day

## 2017-11-16 LAB — CBC WITH DIFFERENTIAL/PLATELET
BASOS ABS: 26 {cells}/uL (ref 0–200)
Basophils Relative: 0.3 %
EOS ABS: 78 {cells}/uL (ref 15–500)
Eosinophils Relative: 0.9 %
HEMATOCRIT: 44.7 % (ref 38.5–50.0)
HEMOGLOBIN: 15.6 g/dL (ref 13.2–17.1)
LYMPHS ABS: 1644 {cells}/uL (ref 850–3900)
MCH: 31.9 pg (ref 27.0–33.0)
MCHC: 34.9 g/dL (ref 32.0–36.0)
MCV: 91.4 fL (ref 80.0–100.0)
MPV: 9.7 fL (ref 7.5–12.5)
Monocytes Relative: 5.8 %
NEUTROS ABS: 6447 {cells}/uL (ref 1500–7800)
Neutrophils Relative %: 74.1 %
Platelets: 347 10*3/uL (ref 140–400)
RBC: 4.89 10*6/uL (ref 4.20–5.80)
RDW: 12.3 % (ref 11.0–15.0)
Total Lymphocyte: 18.9 %
WBC mixed population: 505 cells/uL (ref 200–950)
WBC: 8.7 10*3/uL (ref 3.8–10.8)

## 2017-11-16 LAB — TSH: TSH: 0.85 mIU/L (ref 0.40–4.50)

## 2017-11-17 ENCOUNTER — Telehealth: Payer: Self-pay | Admitting: Family Medicine

## 2017-11-17 NOTE — Telephone Encounter (Signed)
Mike Wong

## 2017-11-17 NOTE — Telephone Encounter (Signed)
Let schedule him to see pulmonary ASAP

## 2017-11-17 NOTE — Telephone Encounter (Signed)
Pt still does not feel well. He called to get lab results and to see what the next steps are in his care and to see if he needs a referral.

## 2017-11-18 ENCOUNTER — Encounter: Payer: Self-pay | Admitting: Family Medicine

## 2017-11-18 ENCOUNTER — Ambulatory Visit (INDEPENDENT_AMBULATORY_CARE_PROVIDER_SITE_OTHER): Payer: Medicare Other | Admitting: Family Medicine

## 2017-11-18 ENCOUNTER — Other Ambulatory Visit: Payer: Self-pay

## 2017-11-18 VITALS — BP 112/60 | HR 70 | Temp 98.5°F | Resp 16 | Wt 163.8 lb

## 2017-11-18 DIAGNOSIS — R0609 Other forms of dyspnea: Principal | ICD-10-CM

## 2017-11-18 DIAGNOSIS — R0602 Shortness of breath: Secondary | ICD-10-CM | POA: Diagnosis not present

## 2017-11-18 NOTE — Telephone Encounter (Signed)
Just making sure you had called about this, if not I can do what I need to. Thanks!

## 2017-11-18 NOTE — Patient Instructions (Signed)
Call me Monday and let me know how the new medicine is working

## 2017-11-18 NOTE — Progress Notes (Signed)
   Subjective:    Patient ID: Mike Wong, male    DOB: Sep 26, 1946, 72 y.o.   MRN: [id number]  HPI He is here for a recheck.  He states that the inhaler does help with his breathing but he needs to use it about 4 times per day.  He still does have difficulty at night when he lies down causing increased shortness of breath but does not describe acid reflux or indigestion symptoms.  He also complains of weight loss although review of the record indicates minimal if any at this point.  He has had blood work, x-rays, echocardiogram and pulmonary function testing all of which was negative except for PFT.  Does have a remote history of smoking.   Review of Systems     Objective:   Physical Exam Alert and in no distress otherwise not examined       Assessment & Plan:  SOB (shortness of breath) He is scheduled for an appointment with pulmonary to further evaluate his lung function since he is responding to albuterol but continues to need this. I also gave him a sample of Dexilant he is to take daily and call me Monday.  At the present time he does not necessarily describe DOE but more difficulty with shortness of breath when lying down.  I explained that I would like to see what this does to all of his symptoms before we proceed further with any workup on questionable weight loss.

## 2017-11-22 ENCOUNTER — Ambulatory Visit (INDEPENDENT_AMBULATORY_CARE_PROVIDER_SITE_OTHER): Payer: Medicare Other | Admitting: Emergency Medicine

## 2017-11-22 ENCOUNTER — Encounter: Payer: Self-pay | Admitting: Emergency Medicine

## 2017-11-22 VITALS — BP 142/90 | HR 76 | Ht 70.0 in | Wt 164.0 lb

## 2017-11-22 DIAGNOSIS — R0600 Dyspnea, unspecified: Secondary | ICD-10-CM

## 2017-11-22 DIAGNOSIS — R0609 Other forms of dyspnea: Secondary | ICD-10-CM

## 2017-11-22 NOTE — Assessment & Plan Note (Signed)
Etiology unclear.  He relates it to an episode that started when he was at altitude and also exposed to cold windy weather.  It has persisted ever since.  Appears to respond to albuterol.  Consider bronchospasm either COPD or asthma.  On an ER visit his troponin and d-dimer were normal.  He has increased symptoms while sleeping, question contribution of GERD.  He did a short trial of Dexilant but this was probably not long enough to really answer the question.  Consider restriction due to respiratory muscle weakness.  I do not see an elevated hemidiaphragm on his chest x-ray.  Also must consider other possible etiologies.  Pulmonary embolism a possibility given his travel but his d-dimer was normal.  Depending on persistence of his symptoms we may decide to look for thromboembolic disease anyway.  He may also need an echocardiogram to rule out occult heart failure.  No stigmata of this on physical exam.  No evidence of anemia.

## 2017-11-22 NOTE — Progress Notes (Signed)
Subjective:    Patient ID: Mike Wong, male    DOB: [birth year], 72 y.o.   MRN: [id number]  HPI 72 year old former smoker (5-10 pk-yrs) with a history of hypertension, hypercholesterolemia.  Referred today for evaluation for shortness of breath and cough. He noticed that he began to have dyspnea when he was at altitude about 3 weeks ago in [place]. 5 hour flight home, returned 12/25. He has continued to have dyspnea since that time. Seems to bother him when it is hot, definitely when he lays down supine. Sometimes coughs as well. Non-productive. He took dexilant x 5 days without any change. He feels a benefit from albuterol. He went to the ED 12/25 > CXR normal, trop and d-dimer normal. He called EMS for dyspnea just after X-mas.   CBC    Component Value Date/Time   WBC 8.7 11/15/2017 1122   RBC 4.89 11/15/2017 1122   HGB 15.6 11/15/2017 1122   HCT 44.7 11/15/2017 1122   PLT 347 11/15/2017 1122   MCV 91.4 11/15/2017 1122   MCH 31.9 11/15/2017 1122   MCHC 34.9 11/15/2017 1122   RDW 12.3 11/15/2017 1122   LYMPHSABS 1,644 11/15/2017 1122   MONOABS 396 03/22/2017 1901   EOSABS 78 11/15/2017 1122   BASOSABS 26 11/15/2017 1122                             b  Review of Systems  Constitutional: Positive for unexpected weight change. Negative for fever.  HENT: Negative for congestion, dental problem, ear pain, nosebleeds, postnasal drip, rhinorrhea, sinus pressure, sneezing, sore throat and trouble swallowing.   Eyes: Negative for redness and itching.  Respiratory: Positive for cough and shortness of breath. Negative for chest tightness and wheezing.   Cardiovascular: Negative for palpitations and leg swelling.  Gastrointestinal: Negative for nausea and vomiting.  Genitourinary: Negative for dysuria.  Musculoskeletal: Negative for joint swelling.  Skin: Negative for rash.  Allergic/Immunologic: Negative.  Negative for environmental allergies, food allergies and immunocompromised state.   Neurological: Negative for headaches.  Hematological: Does not bruise/bleed easily.  Psychiatric/Behavioral: Negative for dysphoric mood. The patient is not nervous/anxious.     Past Medical History:  Diagnosis Date  . Aneurysm artery, popliteal ([place]) 02/2016   RT LEG   . Arthritis   . Clotting disorder (HCC)    leg artery aneurysm   . Finger fracture, right    5th finger  . High cholesterol   . Hypertension   . Kidney stones      Family History  Problem Relation Age of Onset  . Colon cancer Neg Hx   . Colon polyps Neg Hx      Social History   Socioeconomic History  . Marital status: Married    Spouse name: Not on file  . Number of children: Not on file  . Years of education: Not on file  . Highest education level: Not on file  Social Needs  . Financial resource strain: Not on file  . Food insecurity - worry: Not on file  . Food insecurity - inability: Not on file  . Transportation needs - medical: Not on file  . Transportation needs - non-medical: Not on file  Occupational History  . Not on file  Tobacco Use  . Smoking status: Former Research scientist (life sciences)  . Smokeless tobacco: Never Used  . Tobacco comment: QUIT SMOKING IN 1982  Substance and Sexual Activity  . Alcohol use: Yes  Alcohol/week: 0.6 oz    Types: 1 Cans of beer per week    Comment: or less  . Drug use: No  . Sexual activity: Yes  Other Topics Concern  . Not on file  Social History Narrative  . 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   No Known Allergies   Outpatient Medications Prior to Visit  Medication Sig Dispense Refill  . albuterol (PROVENTIL HFA;VENTOLIN HFA) 108 (90 Base) MCG/ACT inhaler Inhale 2 puffs into the lungs every 6 (six) hours as needed for wheezing or shortness of breath. 1 Inhaler 0  . amLODipine (NORVASC) 10 MG tablet Take 1 tablet (10 mg total) by mouth daily. 90 tablet 3  . aspirin 81 MG chewable tablet Chew 81 mg by mouth daily.    Marland Kitchen  atorvastatin (LIPITOR) 10 MG tablet Take 1 tablet (10 mg total) by mouth daily. 90 tablet 3  . omega-3 acid ethyl esters (LOVAZA) 1 g capsule Take 1 g by mouth daily.     No facility-administered medications prior to visit.         Objective:   Physical Exam Vitals:   11/22/17 1538  BP: (!) 142/90  Pulse: 76  SpO2: 99%  Weight: 164 lb (74.4 kg)  Height: 5\' 10"  (1.778 m)   Gen: Pleasant, well-nourished, in no distress,  normal affect  ENT: No lesions,  mouth clear,  oropharynx clear, no postnasal drip  Neck: No JVD, no TMG, no carotid bruits  Lungs: No use of accessory muscles, clear without rales or rhonchi  Cardiovascular: RRR, heart sounds normal, no murmur or gallops, no peripheral edema  Musculoskeletal: No deformities, no cyanosis or clubbing  Neuro: alert, non focal  Skin: Warm, no lesions or rash      Assessment & Plan:  DOE (dyspnea on exertion) Etiology unclear.  He relates it to an episode that started when he was at altitude and also exposed to cold windy weather.  It has persisted ever since.  Appears to respond to albuterol.  Consider bronchospasm either COPD or asthma.  On an ER visit his troponin and d-dimer were normal.  He has increased symptoms while sleeping, question contribution of GERD.  He did a short trial of Dexilant but this was probably not long enough to really answer the question.  Consider restriction due to respiratory muscle weakness.  I do not see an elevated hemidiaphragm on his chest x-ray.  Also must consider other possible etiologies.  Pulmonary embolism a possibility given his travel but his d-dimer was normal.  Depending on persistence of his symptoms we may decide to look for thromboembolic disease anyway.  He may also need an echocardiogram to rule out occult heart failure.  No stigmata of this on physical exam.  No evidence of anemia.   Baltazar Apo, MD, PhD 11/22/2017, 4:13 PM [facility] [phone] or if no  answer [phone]

## 2017-11-22 NOTE — Patient Instructions (Signed)
We will perform full pulmonary function testing Please continue to use albuterol 2 puffs up to every 4 hours if needed for shortness of breath, chest tightness. Depending on results of your breathing tests and any changes in your breathing we may decide to do other workup and testing. Follow with Dr Lamonte Sakai next available after your pulmonary function test

## 2017-11-24 ENCOUNTER — Ambulatory Visit (INDEPENDENT_AMBULATORY_CARE_PROVIDER_SITE_OTHER): Payer: Medicare Other | Admitting: Family Medicine

## 2017-11-24 ENCOUNTER — Encounter: Payer: Self-pay | Admitting: Family Medicine

## 2017-11-24 VITALS — BP 140/84 | HR 80 | Ht 70.0 in | Wt 167.0 lb

## 2017-11-24 DIAGNOSIS — K140 Glossitis: Secondary | ICD-10-CM | POA: Diagnosis not present

## 2017-11-24 NOTE — Progress Notes (Signed)
   Subjective:    Patient ID: Mike Wong, male    DOB: 1945/11/27, 72 y.o.   MRN: [id number]  HPI He is here for evaluation of a feeling of dry mouth as well as a numb tingling sensation in his tongue.  No fever, chills, sore throat, swelling.  He was recently evaluated by pulmonary.  His weight seems to be stable.   Review of Systems     Objective:   Physical Exam Alert and in no distress.  Exam of his mouth shows his tongue to be slightly pitted but no other lesions noted.  Mucosa is moist.  KOH was negative.  Rest the oral mucosa was normal.  Neck is supple without adenopathy.       Assessment & Plan:  Glossitis He does have dentures so I recommend that he switch from Listerine to a nonalcohol based mouthwash or possibly use half water and peroxide to see if this will help.  Also discussed the use of lemon drops to help with his dry mouth Follow-up as needed.

## 2017-11-24 NOTE — Patient Instructions (Signed)
Try different mouthwash something that does not have alcohol in it.  You can also try half water and half peroxide to rinse your mouth out.

## 2017-12-07 ENCOUNTER — Other Ambulatory Visit: Payer: Self-pay | Admitting: Medical

## 2017-12-16 ENCOUNTER — Ambulatory Visit (INDEPENDENT_AMBULATORY_CARE_PROVIDER_SITE_OTHER): Payer: Medicare Other | Admitting: Emergency Medicine

## 2017-12-16 ENCOUNTER — Encounter: Payer: Self-pay | Admitting: Emergency Medicine

## 2017-12-16 DIAGNOSIS — R0602 Shortness of breath: Secondary | ICD-10-CM

## 2017-12-16 DIAGNOSIS — R0600 Dyspnea, unspecified: Secondary | ICD-10-CM

## 2017-12-16 LAB — PULMONARY FUNCTION TEST
DL/VA % pred: 81 %
DL/VA: 3.76 ml/min/mmHg/L
DLCO cor % pred: 74 %
DLCO cor: 23.96 ml/min/mmHg
DLCO unc % pred: 76 %
DLCO unc: 24.61 ml/min/mmHg
FEF 25-75 Post: 3.29 L/sec
FEF 25-75 Pre: 3.22 L/sec
FEF2575-%Change-Post: 2 %
FEF2575-%Pred-Post: 132 %
FEF2575-%Pred-Pre: 129 %
FEV1-%Change-Post: 1 %
FEV1-%Pred-Post: 114 %
FEV1-%Pred-Pre: 113 %
FEV1-Post: 3.74 L
FEV1-Pre: 3.7 L
FEV1FVC-%Change-Post: 5 %
FEV1FVC-%Pred-Pre: 106 %
FEV6-%Change-Post: -3 %
FEV6-%Pred-Post: 107 %
FEV6-%Pred-Pre: 112 %
FEV6-Post: 4.51 L
FEV6-Pre: 4.69 L
FEV6FVC-%Change-Post: 0 %
FEV6FVC-%Pred-Post: 106 %
FEV6FVC-%Pred-Pre: 105 %
FVC-%Change-Post: -4 %
FVC-%Pred-Post: 101 %
FVC-%Pred-Pre: 106 %
FVC-Post: 4.51 L
FVC-Pre: 4.71 L
Post FEV1/FVC ratio: 83 %
Post FEV6/FVC ratio: 100 %
Pre FEV1/FVC ratio: 78 %
Pre FEV6/FVC Ratio: 100 %
RV % pred: 93 %
RV: 2.28 L
TLC % pred: 97 %
TLC: 6.83 L

## 2017-12-16 NOTE — Assessment & Plan Note (Signed)
Normal echocardiogram and chest x-ray from December.  Normal pulmonary function testing today.  Etiology remains elusive.  He does feel like he benefits from albuterol.  He took albuterol about 4 hours before his PFT.  I will perform a cardiopulmonary exercise test to try and investigate further.  Also perform a VQ scan to evaluate for possible chronic thromboembolic disease.  We will perform a cardiopulmonary exercise test to better evaluate your shortness of breath We will perform a ventilation perfusion scan Follow with Dr Lamonte Sakai next available opening after your testing to review the results.

## 2017-12-16 NOTE — Progress Notes (Signed)
PFT complete today 12/16/17.

## 2017-12-16 NOTE — Patient Instructions (Addendum)
We will perform a cardiopulmonary exercise test to better evaluate your shortness of breath We will perform a ventilation perfusion scan Follow with Dr Lamonte Sakai next available opening after your testing to review the results.

## 2017-12-16 NOTE — Progress Notes (Signed)
Subjective:    Patient ID: Mike Wong, male    DOB: 1946-05-17, 72 y.o.   MRN: [id number]  Shortness of Breath  Pertinent negatives include no ear pain, fever, headaches, leg swelling, rash, rhinorrhea, sore throat, vomiting or wheezing.   72 year old former smoker (5-10 pk-yrs) with a history of hypertension, hypercholesterolemia.  Referred today for evaluation for shortness of breath and cough. He noticed that he began to have dyspnea when he was at altitude about 3 weeks ago in [place]. 5 hour flight home, returned 12/25. He has continued to have dyspnea since that time. Seems to bother him when it is hot, definitely when he lays down supine. Sometimes coughs as well. Non-productive. He took dexilant x 5 days without any change. He feels a benefit from albuterol. He went to the ED 12/25 > CXR normal, trop and d-dimer normal. He called EMS for dyspnea just after X-mas.   ROV 12/16/17 --patient follows up today for dyspnea that he noticed when he was at altitude improved and then subsequently on the flight home back to [facility].  Etiology unclear.  He underwent pulmonary function testing today that I have reviewed.  This shows normal spirometry without a bronchodilator response (but he took albuterol 4 hours before the test), normal lung volumes, slightly decreased diffusion capacity that corrects to the normal range when adjusted for alveolar volume.  Likewise his flow volume loops are normal. He is taking albuterol several times a day. His breathing during the day is OK, but he does feel a need for SABA. TTE 11/12/17 was reassuring.    CBC    Component Value Date/Time   WBC 8.7 11/15/2017 1122   RBC 4.89 11/15/2017 1122   HGB 15.6 11/15/2017 1122   HCT 44.7 11/15/2017 1122   PLT 347 11/15/2017 1122   MCV 91.4 11/15/2017 1122   MCH 31.9 11/15/2017 1122   MCHC 34.9 11/15/2017 1122   RDW 12.3 11/15/2017 1122   LYMPHSABS 1,644 11/15/2017 1122   MONOABS 396 03/22/2017 1901   EOSABS 78 11/15/2017 1122   BASOSABS 26 11/15/2017 1122                             b  Review of Systems  Constitutional: Positive for unexpected weight change. Negative for fever.  HENT: Negative for congestion, dental problem, ear pain, nosebleeds, postnasal drip, rhinorrhea, sinus pressure, sneezing, sore throat and trouble swallowing.   Eyes: Negative for redness and itching.  Respiratory: Positive for cough and shortness of breath. Negative for chest tightness and wheezing.   Cardiovascular: Negative for palpitations and leg swelling.  Gastrointestinal: Negative for nausea and vomiting.  Genitourinary: Negative for dysuria.  Musculoskeletal: Negative for joint swelling.  Skin: Negative for rash.  Allergic/Immunologic: Negative.  Negative for environmental allergies, food allergies and immunocompromised state.  Neurological: Negative for headaches.  Hematological: Does not bruise/bleed easily.  Psychiatric/Behavioral: Negative for dysphoric mood. The patient is not nervous/anxious.     Past Medical History:  Diagnosis Date  . 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 Arthritis   . Clotting disorder (HCC)    leg artery aneurysm   . Finger fracture, right    5th finger  . High cholesterol   . Hypertension   . Kidney stones      Family History  Problem Relation Age of Onset  . Colon cancer Neg Hx   . Colon polyps Neg Hx  Social History   Socioeconomic History  . Marital status: Married    Spouse name: Not on file  . Number of children: Not on file  . Years of education: Not on file  . Highest education level: Not on file  Social Needs  . Financial resource strain: Not on file  . Food insecurity - worry: Not on file  . Food insecurity - inability: Not on file  . Transportation needs - medical: Not on file  . Transportation needs - non-medical: Not on file  Occupational History  . Not on file  Tobacco Use  . Smoking status: Former Research scientist (life sciences)  .  Smokeless tobacco: Never Used  . Tobacco comment: QUIT SMOKING IN 1982  Substance and Sexual Activity  . Alcohol use: Yes    Alcohol/week: 0.6 oz    Types: 1 Cans of beer per week    Comment: or less  . Drug use: No  . Sexual activity: Yes  Other Topics Concern  . Not on file  Social History Narrative  . 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   No Known Allergies   Outpatient Medications Prior to Visit  Medication Sig Dispense Refill  . amLODipine (NORVASC) 10 MG tablet Take 1 tablet (10 mg total) by mouth daily. 90 tablet 3  . aspirin 81 MG chewable tablet Chew 81 mg by mouth daily.    Marland Kitchen atorvastatin (LIPITOR) 10 MG tablet Take 1 tablet (10 mg total) by mouth daily. 90 tablet 3  . omega-3 acid ethyl esters (LOVAZA) 1 g capsule Take 1 g by mouth daily.    Marland Kitchen PROAIR HFA 108 (90 Base) MCG/ACT inhaler INHALE 2 PUFFS INTO THE LUNGS EVERY 6 HOURS AS NEEDED FOR WHEEZING OR SHORTNESS OF BREATH 8.5 g 0   No facility-administered medications prior to visit.         Objective:   Physical Exam Vitals:   12/16/17 1611 12/16/17 1612  BP:  120/72  Pulse:  74  SpO2:  98%  Weight: 171 lb 9.6 oz (77.8 kg)   Height: 5\' 10"  (1.778 m)    Gen: Pleasant, well-nourished, in no distress,  normal affect  ENT: No lesions,  mouth clear,  oropharynx clear, no postnasal drip  Neck: No JVD, no TMG, no carotid bruits  Lungs: No use of accessory muscles, clear without rales or rhonchi  Cardiovascular: RRR, heart sounds normal, no murmur or gallops, no peripheral edema  Musculoskeletal: No deformities, no cyanosis or clubbing  Neuro: alert, non focal  Skin: Warm, no lesions or rash      Assessment & Plan:  DOE (dyspnea on exertion) Normal echocardiogram and chest x-ray from December.  Normal pulmonary function testing today.  Etiology remains elusive.  He does feel like he benefits from albuterol.  He took albuterol about 4 hours before his PFT.  I  will perform a cardiopulmonary exercise test to try and investigate further.  Also perform a VQ scan to evaluate for possible chronic thromboembolic disease.  We will perform a cardiopulmonary exercise test to better evaluate your shortness of breath We will perform a ventilation perfusion scan Follow with Dr Lamonte Sakai next available opening after your testing to review the results.  Baltazar Apo, MD, PhD 12/16/2017, 4:39 PM  Pulmonary and Critical Care [phone] or if no answer [phone]

## 2017-12-23 ENCOUNTER — Encounter (HOSPITAL_COMMUNITY)
Admission: RE | Admit: 2017-12-23 | Discharge: 2017-12-23 | Disposition: A | Discharge status: Home / Self Care | Payer: Medicare Other | Source: Ambulatory Visit | Attending: Emergency Medicine | Admitting: Emergency Medicine

## 2017-12-23 ENCOUNTER — Ambulatory Visit (HOSPITAL_COMMUNITY)
Admission: RE | Admit: 2017-12-23 | Discharge: 2017-12-23 | Disposition: A | Discharge status: Home / Self Care | Payer: Medicare Other | Source: Ambulatory Visit | Attending: Emergency Medicine | Admitting: Emergency Medicine

## 2017-12-23 DIAGNOSIS — R0602 Shortness of breath: Secondary | ICD-10-CM

## 2017-12-23 MED ORDER — TECHNETIUM TO 99M ALBUMIN AGGREGATED
4.0000 | Freq: Once | INTRAVENOUS | Status: AC | PRN
  Administered 2017-12-23: 4 via INTRAVENOUS

## 2017-12-23 MED ORDER — TECHNETIUM TC 99M DIETHYLENETRIAME-PENTAACETIC ACID
30.0000 | Freq: Once | INTRAVENOUS | Status: AC | PRN
  Administered 2017-12-23: 30 via RESPIRATORY_TRACT

## 2017-12-24 ENCOUNTER — Ambulatory Visit (HOSPITAL_COMMUNITY): Payer: Medicare Other | Attending: Emergency Medicine

## 2017-12-24 DIAGNOSIS — R0602 Shortness of breath: Secondary | ICD-10-CM | POA: Insufficient documentation

## 2018-01-02 DIAGNOSIS — R0602 Shortness of breath: Secondary | ICD-10-CM | POA: Diagnosis not present

## 2018-01-13 ENCOUNTER — Ambulatory Visit (INDEPENDENT_AMBULATORY_CARE_PROVIDER_SITE_OTHER): Payer: Medicare Other | Admitting: Emergency Medicine

## 2018-01-13 ENCOUNTER — Encounter: Payer: Self-pay | Admitting: Emergency Medicine

## 2018-01-13 DIAGNOSIS — R0609 Other forms of dyspnea: Secondary | ICD-10-CM | POA: Diagnosis not present

## 2018-01-13 NOTE — Assessment & Plan Note (Signed)
Of his dyspnea is not entirely clear.  He does note that it was happening at night and may have sometimes been associated with late meals, some bloating.  He is no longer experiencing it.  He underwent a cardiac pulmonary exercise test that was normal.  His VQ scan was normal.  We have not found an etiology and I believe that this is good news.  We will not do any more workup at this time.  I will follow-up with him as needed.

## 2018-01-13 NOTE — Progress Notes (Signed)
Subjective:    Patient ID: Mike Wong, male    DOB: [birth year], 72 y.o.   MRN: [id number]  Shortness of Breath  Pertinent negatives include no ear pain, fever, headaches, leg swelling, rash, rhinorrhea, sore throat, vomiting or wheezing.   72 year old former smoker (5-10 pk-yrs) with a history of hypertension, hypercholesterolemia.  Referred today for evaluation for shortness of breath and cough. He noticed that he began to have dyspnea when he was at altitude about 3 weeks ago in [place]. 5 hour flight home, returned 12/25. He has continued to have dyspnea since that time. Seems to bother him when it is hot, definitely when he lays down supine. Sometimes coughs as well. Non-productive. He took dexilant x 5 days without any change. He feels a benefit from albuterol. He went to the ED 12/25 > CXR normal, trop and d-dimer normal. He called EMS for dyspnea just after X-mas.   ROV 12/16/17 --patient follows up today for dyspnea that he noticed when he was at altitude improved and then subsequently on the flight home back to [facility]  Etiology unclear.  He underwent pulmonary function testing today that I have reviewed.  This shows normal spirometry without a bronchodilator response (but he took albuterol 4 hours before the test), normal lung volumes, slightly decreased diffusion capacity that corrects to the normal range when adjusted for alveolar volume.  Likewise his flow volume loops are normal. He is taking albuterol several times a day. His breathing during the day is OK, but he does feel a need for SABA. TTE 11/12/17 was reassuring.   ROV 01/13/18 --this is a follow-up visit for evaluation of exertional dyspnea.  He had grossly normal spirometry as detailed above.  Also had a normal echocardiogram.  In absence of any explanation I asked him to undergo a cardiopulmonary exercise test which was done on 12/24/17.  The study showed normal functional capacity without any evidence for  cardiopulmonary limitation.  There was no exercise-induced bronchospasm, there was no cardiac limitation.  The patient did have some hypertension at peak Exercise.  A VQ scan was done on 12/23/17 that was normal. He tells me that he feels better, no real dyspnea.    CBC    Component Value Date/Time   WBC 8.7 11/15/2017 1122   RBC 4.89 11/15/2017 1122   HGB 15.6 11/15/2017 1122   HCT 44.7 11/15/2017 1122   PLT 347 11/15/2017 1122   MCV 91.4 11/15/2017 1122   MCH 31.9 11/15/2017 1122   MCHC 34.9 11/15/2017 1122   RDW 12.3 11/15/2017 1122   LYMPHSABS 1,644 11/15/2017 1122   MONOABS 396 03/22/2017 1901   EOSABS 78 11/15/2017 1122   BASOSABS 26 11/15/2017 1122                             b  Review of Systems  Constitutional: Positive for unexpected weight change. Negative for fever.  HENT: Negative for congestion, dental problem, ear pain, nosebleeds, postnasal drip, rhinorrhea, sinus pressure, sneezing, sore throat and trouble swallowing.   Eyes: Negative for redness and itching.  Respiratory: Negative for cough, chest tightness, shortness of breath and wheezing.   Cardiovascular: Negative for palpitations and leg swelling.  Gastrointestinal: Negative for nausea and vomiting.  Genitourinary: Negative for dysuria.  Musculoskeletal: Negative for joint swelling.  Skin: Negative for rash.  Allergic/Immunologic: Negative.  Negative for environmental allergies, food allergies and immunocompromised state.  Neurological: Negative for headaches.  Hematological:  Does not bruise/bleed easily.  Psychiatric/Behavioral: Negative for dysphoric mood. The patient is not nervous/anxious.     Past Medical History:  Diagnosis Date  . 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 Arthritis   . Clotting disorder (HCC)    leg artery aneurysm   . Finger fracture, right    5th finger  . High cholesterol   . Hypertension   . Kidney stones      Family History  Problem Relation Age of Onset    . Colon cancer Neg Hx   . Colon polyps Neg Hx      Social History   Socioeconomic History  . Marital status: Married    Spouse name: Not on file  . Number of children: Not on file  . Years of education: Not on file  . Highest education level: Not on file  Social Needs  . Financial resource strain: Not on file  . Food insecurity - worry: Not on file  . Food insecurity - inability: Not on file  . Transportation needs - medical: Not on file  . Transportation needs - non-medical: Not on file  Occupational History  . Not on file  Tobacco Use  . Smoking status: Former Research scientist (life sciences)  . Smokeless tobacco: Never Used  . Tobacco comment: QUIT SMOKING IN 1982  Substance and Sexual Activity  . Alcohol use: Yes    Alcohol/week: 0.6 oz    Types: 1 Cans of beer per week    Comment: or less  . Drug use: No  . Sexual activity: Yes  Other Topics Concern  . Not on file  Social History Narrative  . 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   No Known Allergies   Outpatient Medications Prior to Visit  Medication Sig Dispense Refill  . amLODipine (NORVASC) 10 MG tablet Take 1 tablet (10 mg total) by mouth daily. 90 tablet 3  . aspirin 81 MG chewable tablet Chew 81 mg by mouth daily.    Marland Kitchen atorvastatin (LIPITOR) 10 MG tablet Take 1 tablet (10 mg total) by mouth daily. 90 tablet 3  . omega-3 acid ethyl esters (LOVAZA) 1 g capsule Take 1 g by mouth daily.    Marland Kitchen PROAIR HFA 108 (90 Base) MCG/ACT inhaler INHALE 2 PUFFS INTO THE LUNGS EVERY 6 HOURS AS NEEDED FOR WHEEZING OR SHORTNESS OF BREATH 8.5 g 0   No facility-administered medications prior to visit.         Objective:   Physical Exam Vitals:   01/13/18 1612 01/13/18 1613  BP:  116/76  Pulse:  70  SpO2:  94%  Weight: 172 lb (78 kg)   Height: 5\' 10"  (1.778 m)    Gen: Pleasant, well-nourished, in no distress,  normal affect  ENT: No lesions,  mouth clear,  oropharynx clear, no postnasal  drip  Neck: No JVD, no stridor  Lungs: No use of accessory muscles, clear without rales or rhonchi  Cardiovascular: RRR, heart sounds normal, no murmur or gallops, no peripheral edema  Musculoskeletal: No deformities, no cyanosis or clubbing  Neuro: alert, non focal  Skin: Warm, no lesions or rash      Assessment & Plan:  DOE (dyspnea on exertion) Of his dyspnea is not entirely clear.  He does note that it was happening at night and may have sometimes been associated with late meals, some bloating.  He is no longer experiencing it.  He  underwent a cardiac pulmonary exercise test that was normal.  His VQ scan was normal.  We have not found an etiology and I believe that this is good news.  We will not do any more workup at this time.  I will follow-up with him as needed.  Baltazar Apo, MD, PhD 01/13/2018, 4:35 PM [facility] [phone] or if no answer [phone]

## 2018-01-13 NOTE — Patient Instructions (Signed)
Your testing is reassuring. This is good news.  Please contact us if you have any recurrence of shortness of breath

## 2018-02-22 ENCOUNTER — Ambulatory Visit (INDEPENDENT_AMBULATORY_CARE_PROVIDER_SITE_OTHER): Payer: Medicare Other | Admitting: Family Medicine

## 2018-02-22 VITALS — BP 138/80 | HR 67 | Temp 98.2°F | Ht 70.0 in | Wt 171.6 lb

## 2018-02-22 DIAGNOSIS — J101 Influenza due to other identified influenza virus with other respiratory manifestations: Secondary | ICD-10-CM | POA: Diagnosis not present

## 2018-02-22 DIAGNOSIS — R509 Fever, unspecified: Secondary | ICD-10-CM | POA: Diagnosis not present

## 2018-02-22 LAB — POC INFLUENZA A&B (BINAX/QUICKVUE)

## 2018-02-22 NOTE — Progress Notes (Signed)
   Subjective:    Patient ID: Mike Wong, male    DOB: March 12, 1946, 72 y.o.   MRN: [id number]  HPI He has a 4-day history of started with cough, fever, chills, malaise and myalgias   Review of Systems     Objective:   Physical Exam Alert and in no distress. Tympanic membranes and canals are normal. Pharyngeal area is normal. Neck is supple without adenopathy or thyromegaly. Cardiac exam shows a regular sinus rhythm without murmurs or gallops. Lungs are clear to auscultation. Flu test is positive       Assessment & Plan:  Fever and chills - Plan: POC Influenza A&B(BINAX/QUICKVUE)  Influenza A  Recommend supportive care with anti-inflammatory/antipyretic as needed.  He may return to work when he is fever free for 24 hours without using an antipyretic.

## 2018-03-15 ENCOUNTER — Other Ambulatory Visit: Payer: Self-pay | Admitting: Family Medicine

## 2018-03-15 DIAGNOSIS — I10 Essential (primary) hypertension: Secondary | ICD-10-CM

## 2018-04-25 ENCOUNTER — Ambulatory Visit (INDEPENDENT_AMBULATORY_CARE_PROVIDER_SITE_OTHER): Payer: Medicare Other | Admitting: Family Medicine

## 2018-04-25 ENCOUNTER — Encounter: Payer: Self-pay | Admitting: Family Medicine

## 2018-04-25 VITALS — BP 110/66 | HR 68 | Temp 98.0°F | Ht 69.0 in | Wt 168.6 lb

## 2018-04-25 DIAGNOSIS — Z87442 Personal history of urinary calculi: Secondary | ICD-10-CM | POA: Diagnosis not present

## 2018-04-25 DIAGNOSIS — Z125 Encounter for screening for malignant neoplasm of prostate: Secondary | ICD-10-CM | POA: Diagnosis not present

## 2018-04-25 DIAGNOSIS — E785 Hyperlipidemia, unspecified: Secondary | ICD-10-CM

## 2018-04-25 DIAGNOSIS — I10 Essential (primary) hypertension: Secondary | ICD-10-CM

## 2018-04-25 DIAGNOSIS — E559 Vitamin D deficiency, unspecified: Secondary | ICD-10-CM | POA: Diagnosis not present

## 2018-04-25 DIAGNOSIS — Z86010 Personal history of colonic polyps: Secondary | ICD-10-CM | POA: Diagnosis not present

## 2018-04-25 DIAGNOSIS — N401 Enlarged prostate with lower urinary tract symptoms: Secondary | ICD-10-CM | POA: Diagnosis not present

## 2018-04-25 MED ORDER — ATORVASTATIN CALCIUM 10 MG PO TABS: 10.0000 mg | ORAL_TABLET | Freq: Every day | ORAL | 3 refills | Status: DC

## 2018-04-25 MED ORDER — AMLODIPINE BESYLATE 10 MG PO TABS: ORAL_TABLET | ORAL | 3 refills | Status: DC

## 2018-04-25 NOTE — Progress Notes (Signed)
Mike Wong is a 72 y.o. male who presents for annual wellness visit and follow-up on chronic medical conditions.  He has no particular concerns or complaints.  He does have a history of colonic polyp and is now set up for routine colonoscopy.  He did have a popliteal artery aneurysm that was repaired.  He does have some BPH but is not having any present symptoms.  He continues on amlodipine without difficulty.  He is also taking atorvastatin as well as omega-3.  He did have some difficulty recently with pulmonary symptoms but these of all cleared up and presently he is having no trouble at all.  He is getting ready to go on a trip to [place] with his wife and his daughter will join them.  He continues to work and has no plans on quitting.  Family and social history as well as health maintenance and immunizations were all reviewed and are listed.   Immunizations and Health Maintenance Immunization History  Administered Date(s) Administered  . Pneumococcal Conjugate-13 03/19/2015  . Pneumococcal Polysaccharide-23 10/22/2010  . Tdap 05/22/2008  . Zoster 10/22/2010   Health Maintenance Due  Topic Date Due  . PNA vac Low Risk Adult (2 of 2 - PPSV23) 03/18/2016    Last colonoscopy: one year ago Last PSA: Today Dentist:04/2108 Ophtho:last year Exercise: stretching, walking   Other doctors caring for patient include: [place].  Robley Fries.  Advanced Directives: Yes.  Copy in chart.    Depression screen:  See questionnaire below.     Depression screen [facility] 2/9 04/25/2018 03/22/2017 03/19/2016 05/14/2014  Decreased Interest 0 0 0 0  Down, Depressed, Hopeless 0 0 0 0  PHQ - 2 Score 0 0 0 0    Fall Screen: See Questionaire below.   Fall Risk  04/25/2018 03/22/2017 03/19/2016 05/14/2014  Falls in the past year? No No No No    ADL screen:  See questionnaire below.  Functional Status Survey: Is the patient deaf or have difficulty hearing?: No Does the patient have difficulty seeing,  even when wearing glasses/contacts?: No Does the patient have difficulty concentrating, remembering, or making decisions?: No Does the patient have difficulty walking or climbing stairs?: No Does the patient have difficulty dressing or bathing?: No Does the patient have difficulty doing errands alone such as visiting a doctor's office or shopping?: No   Review of Systems  Constitutional: -, -unexpected weight change, -anorexia, -fatigue Allergy: -sneezing, -itching, -congestion Dermatology: denies changing moles, rash, lumps ENT: -runny nose, -ear pain, -sore throat,  Cardiology:  -chest pain, -palpitations, -orthopnea, Respiratory: -cough, -shortness of breath, -dyspnea on exertion, -wheezing,  Gastroenterology: -abdominal pain, -nausea, -vomiting, -diarrhea, -constipation, -dysphagia Hematology: -bleeding or bruising problems Musculoskeletal: -arthralgias, -myalgias, -joint swelling, -back pain, - Ophthalmology: -vision changes,  Urology: -dysuria, -difficulty urinating,  -urinary frequency, -urgency, incontinence Neurology: -, -numbness, , -memory loss, -falls, -dizziness    PHYSICAL EXAM:   General Appearance: Alert, cooperative, no distress, appears stated age Head: Normocephalic, without obvious abnormality, atraumatic Eyes: PERRL, conjunctiva/corneas clear, EOM's intact, fundi benign Ears: Normal TM's and external ear canals Nose: Nares normal, mucosa normal, no drainage or sinus   tenderness Throat: Lips, mucosa, and tongue normal; teeth and gums normal Neck: Supple, no lymphadenopathy, thyroid:no enlargement/tenderness/nodules; no carotid bruit or JVD Lungs: Clear to auscultation bilaterally without wheezes, rales or ronchi; respirations unlabored Heart: Regular rate and rhythm, S1 and S2 normal, no murmur, rub or gallop Abdomen: Soft, non-tender, nondistended, normoactive bowel sounds, no masses, no hepatosplenomegaly Extremities:  No clubbing, cyanosis or edema Pulses:  2+ and symmetric all extremities Skin: Skin color, texture, turgor normal, no rashes or lesions Lymph nodes: Cervical, supraclavicular, and axillary nodes normal Neurologic: CNII-XII intact, normal strength, sensation and gait; reflexes 2+ and symmetric throughout   Psych: Normal mood, affect, hygiene and grooming  ASSESSMENT/PLAN: History of renal stone  Hyperlipidemia with target LDL less than 130  Essential hypertension  History of colonic polyps  Benign prostatic hyperplasia with lower urinary tract symptoms, symptom details unspecified  Screening for prostate cancer  Vitamin D deficiency     Discussed PSA screening (risks/benefits), recommended at least 30 minutes of aerobic activity at least 5 days/week;  Immunization recommendations discussed.  Colonoscopy recommendations reviewed.   Medicare Attestation I have personally reviewed: The patient's medical and social history Their use of alcohol, tobacco or illicit drugs Their current medications and supplements The patient's functional ability including ADLs,fall risks, home safety risks, cognitive, and hearing and visual impairment Diet and physical activities Evidence for depression or mood disorders  The patient's weight, height, and BMI have been recorded in the chart.  I have made referrals, counseling, and provided education to the patient based on review of the above and I have provided the patient with a written personalized care plan for preventive services.     [name], MD   04/25/2018

## 2018-04-25 NOTE — Patient Instructions (Signed)
  [name] , Thank you for taking time to come for your Medicare Wellness Visit. I appreciate your ongoing commitment to your health goals. Please review the following plan we discussed and let me know if I can assist you in the future.   These are the goals we discussed: Goals    None      This is a list of the screening recommended for you and due dates:  Health Maintenance  Topic Date Due  . Pneumonia vaccines (2 of 2 - PPSV23) 03/18/2016  . Tetanus Vaccine  05/22/2018  . Flu Shot  06/16/2018  . Colon Cancer Screening  10/29/2019  .  Hepatitis C: One time screening is recommended by Center for Disease Control  (CDC) for  adults born from [birth year] through 1963.   Completed

## 2018-04-26 LAB — CBC WITH DIFFERENTIAL/PLATELET
Basophils Absolute: 0 10*3/uL (ref 0.0–0.2)
Basos: 0 %
EOS (ABSOLUTE): 0.1 10*3/uL (ref 0.0–0.4)
EOS: 1 %
Hematocrit: 43 % (ref 37.5–51.0)
Hemoglobin: 14.6 g/dL (ref 13.0–17.7)
IMMATURE GRANS (ABS): 0 10*3/uL (ref 0.0–0.1)
IMMATURE GRANULOCYTES: 0 %
LYMPHS: 17 %
Lymphocytes Absolute: 1.1 10*3/uL (ref 0.7–3.1)
MCH: 31.4 pg (ref 26.6–33.0)
MCHC: 34 g/dL (ref 31.5–35.7)
MCV: 93 fL (ref 79–97)
MONOCYTES: 6 %
MONOS ABS: 0.4 10*3/uL (ref 0.1–0.9)
NEUTROS PCT: 76 %
Neutrophils Absolute: 5 10*3/uL (ref 1.4–7.0)
Platelets: 304 10*3/uL (ref 150–450)
RBC: 4.65 x10E6/uL (ref 4.14–5.80)
RDW: 14 % (ref 12.3–15.4)
WBC: 6.6 10*3/uL (ref 3.4–10.8)

## 2018-04-26 LAB — LIPID PANEL
CHOL/HDL RATIO: 2.8 ratio (ref 0.0–5.0)
Cholesterol, Total: 185 mg/dL (ref 100–199)
HDL: 66 mg/dL (ref >39)
LDL Calculated: 92 mg/dL (ref 0–99)
TRIGLYCERIDES: 134 mg/dL (ref 0–149)
VLDL Cholesterol Cal: 27 mg/dL (ref 5–40)

## 2018-04-26 LAB — COMPREHENSIVE METABOLIC PANEL
ALT: 19 IU/L (ref 0–44)
AST: 21 IU/L (ref 0–40)
Albumin/Globulin Ratio: 2.2 (ref 1.2–2.2)
Albumin: 4.8 g/dL (ref 3.5–4.8)
Alkaline Phosphatase: 93 IU/L (ref 39–117)
BUN/Creatinine Ratio: 14 (ref 10–24)
BUN: 15 mg/dL (ref 8–27)
Bilirubin Total: 0.5 mg/dL (ref 0.0–1.2)
CALCIUM: 9.8 mg/dL (ref 8.6–10.2)
CO2: 25 mmol/L (ref 20–29)
Chloride: 102 mmol/L (ref 96–106)
Creatinine, Ser: 1.07 mg/dL (ref 0.76–1.27)
GFR, EST AFRICAN AMERICAN: 81 mL/min/{1.73_m2} (ref >59)
GFR, EST NON AFRICAN AMERICAN: 70 mL/min/{1.73_m2} (ref >59)
GLOBULIN, TOTAL: 2.2 g/dL (ref 1.5–4.5)
Glucose: 99 mg/dL (ref 65–99)
Potassium: 4 mmol/L (ref 3.5–5.2)
SODIUM: 142 mmol/L (ref 134–144)
TOTAL PROTEIN: 7 g/dL (ref 6.0–8.5)

## 2018-04-26 LAB — VITAMIN D 25 HYDROXY (VIT D DEFICIENCY, FRACTURES): Vit D, 25-Hydroxy: 48.1 ng/mL (ref 30.0–100.0)

## 2018-04-26 LAB — PSA: Prostate Specific Ag, Serum: 2.5 ng/mL (ref 0.0–4.0)

## 2018-05-09 ENCOUNTER — Other Ambulatory Visit: Payer: Self-pay | Admitting: Family Medicine

## 2018-05-09 DIAGNOSIS — E785 Hyperlipidemia, unspecified: Secondary | ICD-10-CM

## 2018-06-27 ENCOUNTER — Other Ambulatory Visit: Payer: Self-pay

## 2018-06-27 ENCOUNTER — Emergency Department (HOSPITAL_COMMUNITY)
Admission: EM | Admit: 2018-06-27 | Discharge: 2018-06-27 | Disposition: A | Discharge status: Home / Self Care | Payer: Medicare Other | Attending: Emergency Medicine | Admitting: Emergency Medicine

## 2018-06-27 ENCOUNTER — Encounter (HOSPITAL_COMMUNITY): Payer: Self-pay

## 2018-06-27 DIAGNOSIS — I10 Essential (primary) hypertension: Secondary | ICD-10-CM | POA: Insufficient documentation

## 2018-06-27 DIAGNOSIS — B029 Zoster without complications: Secondary | ICD-10-CM | POA: Diagnosis not present

## 2018-06-27 DIAGNOSIS — Z87891 Personal history of nicotine dependence: Secondary | ICD-10-CM | POA: Insufficient documentation

## 2018-06-27 DIAGNOSIS — Z7982 Long term (current) use of aspirin: Secondary | ICD-10-CM | POA: Insufficient documentation

## 2018-06-27 DIAGNOSIS — Z79899 Other long term (current) drug therapy: Secondary | ICD-10-CM | POA: Diagnosis not present

## 2018-06-27 DIAGNOSIS — R21 Rash and other nonspecific skin eruption: Secondary | ICD-10-CM | POA: Diagnosis not present

## 2018-06-27 MED ORDER — VALACYCLOVIR HCL 1 G PO TABS: 1000.0000 mg | ORAL_TABLET | Freq: Three times a day (TID) | ORAL | 0 refills | Status: DC

## 2018-06-27 NOTE — Discharge Instructions (Addendum)
Please read attached information. If you experience any new or worsening signs or symptoms please return to the emergency room for evaluation. Please follow-up with your primary care provider or specialist as discussed. Please use medication prescribed only as directed and discontinue taking if you have any concerning signs or symptoms.   °

## 2018-06-27 NOTE — ED Provider Notes (Signed)
[facility] EMERGENCY DEPARTMENT Provider Note   CSN: [id number] Arrival date & time: 06/27/18  1156   History   Chief Complaint Chief Complaint  Patient presents with  . Rash    HPI Mike Wong is a 72 y.o. male.  HPI    62 male presents today with complaints of rash. Patient notes a spreading rash to the right flank and back. He notes this started approximately 3 days ago, he denies any pain or itchiness, denies any other involvement of the body, fever. No history of the same, notes he did have chickenpox as a kid, also notes that he received a shingles vaccine in 2011.  Past Medical History:  Diagnosis Date  . 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 Arthritis   . Clotting disorder (HCC)    leg artery aneurysm   . Finger fracture, right    5th finger  . High cholesterol   . Hypertension   . Kidney stones     Patient Active Problem List   Diagnosis Date Noted  . History of colonic polyps 03/13/2014  . Hyperlipidemia with target LDL less than 130 03/13/2014  . History of vitamin D deficiency 03/13/2014  . BPH (benign prostatic hyperplasia) 03/13/2014  . History of renal stone 03/13/2014  . Hypertension 10/20/2012    Past Surgical History:  Procedure Laterality Date  . BYPASS GRAFT POPLITEAL TO POPLITEAL Right 03/03/2016   Procedure: BYPASS RIGHT ABOVE POPLITEAL TO BELOW POPLITEAL USING REVERSED RIGHT GREATER SAPHENOUS VEIN;  Surgeon: Elam Dutch, MD;  Location: [place];  Service: Vascular;  Laterality: Right;  . COLONOSCOPY  11/04/06  . ELECTROCARDIOGRAM  03/16/03  . EMBOLECTOMY Right 03/03/2016   Procedure: RIGHT ANTERIOR TIBIAL EMBOLECTOMY;  Surgeon: Elam Dutch, MD;  Location: [place];  Service: Vascular;  Laterality: Right;  . FALSE ANEURYSM REPAIR Right 03/03/2016   Procedure: LIGATION RIGHT  POPLITEAL ARTERY ANEURYSM;  Surgeon: Elam Dutch, MD;  Location: [place];  Service: Vascular;  Laterality: Right;  .  LITHOTRIPSY    . LITHOTRIPSY    . OPEN REDUCTION INTERNAL FIXATION (ORIF) METACARPAL Right 10/08/2015   Procedure: OPEN REDUCTION INTERNAL FIXATION (ORIF) METACARPAL RIGHT RING FINGER;  Surgeon: Daryll Brod, MD;  Location: [place];  Service: Orthopedics;  Laterality: Right;        Home Medications    Prior to Admission medications   Medication Sig Start Date End Date Taking? Authorizing Provider  amLODipine (NORVASC) 10 MG tablet TAKE 1 TABLET(10 MG) BY MOUTH DAILY 04/25/18   [name], MD  aspirin 81 MG chewable tablet Chew 81 mg by mouth daily.    [provider]  atorvastatin (LIPITOR) 10 MG tablet Take 1 tablet (10 mg total) by mouth daily. 04/25/18   [name], MD  Coenzyme Q10 (CO Q10) 100 MG CAPS Take 300 mg by mouth.    [provider]  KRILL OIL PO Take by mouth.    [provider]  omega-3 acid ethyl esters (LOVAZA) 1 g capsule Take 1 g by mouth daily.    [provider]  PROAIR HFA 108 [phone] Base) MCG/ACT inhaler INHALE 2 PUFFS INTO THE LUNGS EVERY 6 HOURS AS NEEDED FOR WHEEZING OR SHORTNESS OF BREATH Patient not taking: Reported on 02/22/2018 12/07/17   Tysinger, Camelia Eng, PA-C  valACYclovir (VALTREX) 1000 MG tablet Take 1 tablet (1,000 mg total) by mouth 3 (three) times daily for 7 days. 06/27/18  07/04/18  Okey Regal, PA-C    Family History Family History  Problem Relation Age of Onset  . Colon cancer Neg Hx   . Colon polyps Neg Hx     Social History Social History   Tobacco Use  . Smoking status: Former Research scientist (life sciences)  . Smokeless tobacco: Never Used  . Tobacco comment: QUIT SMOKING IN 1982  Substance Use Topics  . Alcohol use: Yes    Alcohol/week: 1.0 standard drinks    Types: 1 Cans of beer per week    Comment: or less  . Drug use: No     Allergies   Patient has no known allergies.   Review of Systems Review of Systems  All other systems reviewed and are negative.  Physical Exam Updated Vital  Signs BP (!) 120/94 (BP Location: Right Arm)   Pulse 94   Temp 98.6 F (37 C) (Oral)   Resp 18   SpO2 99%   Physical Exam  Constitutional: He is oriented to person, place, and time. He appears well-developed and well-nourished.  HENT:  Head: Normocephalic and atraumatic.  Eyes: Pupils are equal, round, and reactive to light. Conjunctivae are normal. Right eye exhibits no discharge. Left eye exhibits no discharge. No scleral icterus.  Neck: Normal range of motion. No JVD present. No tracheal deviation present.  Pulmonary/Chest: Effort normal. No stridor.  Neurological: He is alert and oriented to person, place, and time. Coordination normal.  Skin:     Erythematous rash noted on the lower ribs posteriorly wrapping around to the anterior lower chest, vesicles noted  Psychiatric: He has a normal mood and affect. His behavior is normal. Judgment and thought content normal.  Nursing note and vitals reviewed.    ED Treatments / Results  Labs (all labs ordered are listed, but only abnormal results are displayed) Labs Reviewed - No data to display  EKG None  Radiology No results found.  Procedures Procedures (including critical care time)  Medications Ordered in ED Medications - No data to display   Initial Impression / Assessment and Plan / ED Course  I have reviewed the triage vital signs and the nursing notes.  Pertinent labs & imaging results that were available during my care of the patient were reviewed by me and considered in my medical decision making (see chart for details).     Labs:   Imaging:  Consults:  Therapeutics:  Discharge Meds: valtrex  Assessment/Plan: 72 year old male presents today with likely shingles. He is well-appearing and in no acute distress, no comp getting features afebrile with no other dermal involvement. Patient will be started on Valtrex with outpatient follow-up with primary care, strict return precautions given. Patient  verbalized understanding and agreement to today's plan had no further questions or concerns at time of discharge.     Final Clinical Impressions(s) / ED Diagnoses   Final diagnoses:  Herpes zoster without complication    ED Discharge Orders         Ordered    valACYclovir (VALTREX) 1000 MG tablet  3 times daily     06/27/18 1350           Okey Regal, PA-C 06/27/18 1350    Julianne Rice, MD 06/27/18 1810

## 2018-06-27 NOTE — ED Triage Notes (Signed)
Pt has rash to the right flank area X3 days.

## 2018-07-04 ENCOUNTER — Encounter: Payer: Self-pay | Admitting: Family Medicine

## 2018-07-04 ENCOUNTER — Ambulatory Visit (INDEPENDENT_AMBULATORY_CARE_PROVIDER_SITE_OTHER): Payer: Medicare Other | Admitting: Family Medicine

## 2018-07-04 VITALS — BP 120/72 | HR 77 | Temp 98.5°F | Ht 70.0 in | Wt 172.6 lb

## 2018-07-04 DIAGNOSIS — B029 Zoster without complications: Secondary | ICD-10-CM

## 2018-07-04 NOTE — Progress Notes (Signed)
   Subjective:    Patient ID: Mike Wong, male    DOB: Feb 23, 1946, 72 y.o.   MRN: [id number]  HPI He was seen August 12 in the emergency room and diagnosed with shingles.  He is here for follow-up.  He is mainly complaining of slight itching but very little pain.   Review of Systems     Objective:   Physical Exam Alert and in no distress.  Healing lesions are noted in the right T10 nerve root distribution.  No evidence of infection.       Assessment & Plan:  Herpes zoster without complication At this point he seems to be doing quite nicely having very little in the way of  residual and he will call with any trouble.

## 2018-07-12 DIAGNOSIS — L308 Other specified dermatitis: Secondary | ICD-10-CM | POA: Diagnosis not present

## 2018-08-04 ENCOUNTER — Encounter: Payer: Self-pay | Admitting: Family

## 2018-08-04 ENCOUNTER — Ambulatory Visit (INDEPENDENT_AMBULATORY_CARE_PROVIDER_SITE_OTHER)
Admission: RE | Admit: 2018-08-04 | Discharge: 2018-08-04 | Disposition: A | Discharge status: Home / Self Care | Payer: Medicare Other | Source: Ambulatory Visit | Attending: Vascular Surgery | Admitting: Vascular Surgery

## 2018-08-04 ENCOUNTER — Other Ambulatory Visit: Payer: Self-pay

## 2018-08-04 ENCOUNTER — Ambulatory Visit (HOSPITAL_COMMUNITY)
Admission: RE | Admit: 2018-08-04 | Discharge: 2018-08-04 | Disposition: A | Discharge status: Home / Self Care | Payer: Medicare Other | Source: Ambulatory Visit | Attending: Vascular Surgery | Admitting: Vascular Surgery

## 2018-08-04 ENCOUNTER — Ambulatory Visit (INDEPENDENT_AMBULATORY_CARE_PROVIDER_SITE_OTHER): Payer: Medicare Other | Admitting: Family

## 2018-08-04 VITALS — BP 127/76 | HR 68 | Temp 98.6°F | Resp 18 | Ht 70.0 in | Wt 169.0 lb

## 2018-08-04 DIAGNOSIS — I724 Aneurysm of artery of lower extremity: Secondary | ICD-10-CM | POA: Insufficient documentation

## 2018-08-04 DIAGNOSIS — Z87891 Personal history of nicotine dependence: Secondary | ICD-10-CM | POA: Insufficient documentation

## 2018-08-04 DIAGNOSIS — I10 Essential (primary) hypertension: Secondary | ICD-10-CM | POA: Diagnosis not present

## 2018-08-04 DIAGNOSIS — Z95828 Presence of other vascular implants and grafts: Secondary | ICD-10-CM | POA: Diagnosis not present

## 2018-08-04 NOTE — Progress Notes (Signed)
[facility] [place]   CC: Follow up s/p right popliteal artery aneurysm repair and tibial embolectomy  History of Present Illness Mike Wong is a 72 y.o. male who is s/p Right above-knee popliteal to below knee popliteal bypass,ipsilateral reversed greater saphenous vein, tibial embolectomy by Dr. Oneida Alar on 03/03/16 for right popliteal aneurysm.   Dr. Oneida Alar last evaluated pt on 06-24-17. At that time pt denied any claudication or rest pain symptoms. He still had some trace edema in the right leg which became worse when he stands for long periods but otherwise he was not really bothered by this. He had no symptoms in the left leg. Previous CT angiogram showed no abdominal aortic aneurysm or left leg aneurysm. 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  He walks most of the time at his job.   He denies any claudication or rest pain symptoms.   Diabetic: No Tobacco use: former smoker, quit in 1982  Pt meds include: Statin :Yes Betablocker: No ASA: Yes Other anticoagulants/antiplatelets: no  Past Medical History:  Diagnosis Date  . 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 Arthritis   . Clotting disorder (HCC)    leg artery aneurysm   . Finger fracture, right    5th finger  . High cholesterol   . Hypertension   . Kidney stones     Social History Social History   Tobacco Use  . Smoking status: Former Research scientist (life sciences)  . Smokeless tobacco: Never Used  . Tobacco comment: QUIT SMOKING IN 1982  Substance Use Topics  . Alcohol use: Yes    Alcohol/week: 1.0 standard drinks    Types: 1 Cans of beer per week    Comment: or less  . Drug use: No    Family History Family History  Problem Relation Age of Onset  . Colon cancer Neg Hx   . Colon polyps Neg Hx     Past Surgical History:  Procedure Laterality Date  . BYPASS GRAFT POPLITEAL TO POPLITEAL Right 03/03/2016   Procedure: BYPASS RIGHT ABOVE POPLITEAL TO BELOW POPLITEAL USING REVERSED  RIGHT GREATER SAPHENOUS VEIN;  Surgeon: Elam Dutch, MD;  Location: [place];  Service: Vascular;  Laterality: Right;  . COLONOSCOPY  11/04/06  . ELECTROCARDIOGRAM  03/16/03  . EMBOLECTOMY Right 03/03/2016   Procedure: RIGHT ANTERIOR TIBIAL EMBOLECTOMY;  Surgeon: Elam Dutch, MD;  Location: [employer] [facility];  Service: Vascular;  Laterality: Right;  . FALSE ANEURYSM REPAIR Right 03/03/2016   Procedure: LIGATION RIGHT  POPLITEAL ARTERY ANEURYSM;  Surgeon: Elam Dutch, MD;  Location: [place];  Service: Vascular;  Laterality: Right;  . LITHOTRIPSY    . LITHOTRIPSY    . OPEN REDUCTION INTERNAL FIXATION (ORIF) METACARPAL Right 10/08/2015   Procedure: OPEN REDUCTION INTERNAL FIXATION (ORIF) METACARPAL RIGHT RING FINGER;  Surgeon: Daryll Brod, MD;  Location: [place];  Service: Orthopedics;  Laterality: Right;    No Known Allergies  Current Outpatient Medications  Medication Sig Dispense Refill  . amLODipine (NORVASC) 10 MG tablet TAKE 1 TABLET(10 MG) BY MOUTH DAILY 90 tablet 3  . aspirin 81 MG chewable tablet Chew 81 mg by mouth daily.    Marland Kitchen atorvastatin (LIPITOR) 10 MG tablet Take 1 tablet (10 mg total) by mouth daily. 90 tablet 3  . augmented betamethasone dipropionate (DIPROLENE-AF) 0.05 % cream   3  . Coenzyme Q10 (CO Q10) 100 MG CAPS Take 300 mg by mouth.    Marland Kitchen  KRILL OIL PO Take by mouth.    . omega-3 acid ethyl esters (LOVAZA) 1 g capsule Take 1 g by mouth daily.     No current facility-administered medications for this visit.     ROS: See HPI for pertinent positives and negatives.   Physical Examination  Vitals:   08/04/18 1448  BP: 127/76  Pulse: 68  Resp: 18  Temp: 98.6 F (37 C)  TempSrc: Oral  SpO2: 96%  Weight: 169 lb (76.7 kg)  Height: 5\' 10"  (1.778 m)   Body mass index is 24.25 kg/m.  General: A&O x 3, WDWN, fit appearing male. Gait: normal HENT: No gross abnormalities.  Eyes: PERRLA. Pulmonary: Respirations are non labored, CTAB, good air  movement in all fields Cardiac: regular rhythm, no detected murmur.         Carotid Bruits Right Left   Negative Negative   Radial pulses are 2+ palpable bilaterally   Adominal aortic pulse is not palpable                         VASCULAR EXAM: Extremities without ischemic changes, without Gangrene; without open wounds.                                                                                                          LE Pulses Right Left       FEMORAL  2+ palpable  2+ palpable        POPLITEAL  not palpable   1+ palpable       POSTERIOR TIBIAL  not palpable   3+ palpable        DORSALIS PEDIS      ANTERIOR TIBIAL 1+ palpable  1+ palpable    Abdomen: soft, NT, no palpable masses. Skin: no rashes, no cellulitis, no ulcers noted. Musculoskeletal: no muscle wasting or atrophy.  Neurologic: A&O X 3; appropriate affect, Sensation is normal; MOTOR FUNCTION:  moving all extremities equally, motor strength 5/5 throughout. Speech is fluent/normal. CN 2-12 intact. Psychiatric: Thought content is normal, mood appropriate for clinical situation.     ASSESSMENT: Mike Wong is a 72 y.o. male who is s/p right above-knee popliteal to below knee popliteal bypass,ipsilateral reversed greater saphenous vein, tibial embolectomy by Dr. Oneida Alar on 03/03/16 for right popliteal aneurysm.  His right leg and right groin incisions have healed completely.  He has no claudication symptoms with walking, works as an Clinical biochemist, climbs ladders on a regular basis, walks a great deal.  There are no signs of ischemia in his feet or legs.   DATA  Right Lower Extremity Arterial Duplex (08-04-18): Right Graft(s): SFA Distal to below the knee popliteal artery bypass graft patent with no evidence of stenosis noted. All triphasic waveforms.  No significant change compared to previous study on 06-10-17.   ABI (Date: 08/04/2018):  R:   ABI: 0.96 (was 1.16 on 06-10-18),   PT: tri  DP: tri  TBI:   0.43, toe pressure 49 (was 0.70)  L:   ABI: 1.15 (was 1.14),  PT: tri  DP: tri  TBI: 0.87, toe pressure 100 (was 0.98) Bilateral ABI remain normal with all triphasic waveforms. Decline in bilateral TBI, left remains in the normal range.     PLAN:  Based on the patient's vascular studies and examination, pt will return to clinic in 1 year with right LE arterial duplex and ABI's.   I discussed in depth with the patient the nature of atherosclerosis, and emphasized the importance of maximal medical management including strict control of blood pressure, blood glucose, and lipid levels, obtaining regular exercise, and continued cessation of smoking.  The patient is aware that without maximal medical management the underlying atherosclerotic disease process will progress, limiting the benefit of any interventions.  The patient was given information about PAD including signs, symptoms, treatment, what symptoms should prompt the patient to seek immediate medical care, and risk reduction measures to take.  Clemon Chambers, RN, MSN, FNP-C [facility] Phone: [phone]  Clinic MD: Oneida Alar  08/04/18 3:17 PM

## 2018-08-04 NOTE — Patient Instructions (Signed)

## 2019-04-27 ENCOUNTER — Encounter: Payer: Self-pay | Admitting: Family Medicine

## 2019-04-27 ENCOUNTER — Other Ambulatory Visit: Payer: Self-pay

## 2019-04-27 ENCOUNTER — Ambulatory Visit (INDEPENDENT_AMBULATORY_CARE_PROVIDER_SITE_OTHER): Payer: Medicare Other | Admitting: Family Medicine

## 2019-04-27 VITALS — BP 140/86 | HR 65 | Temp 98.1°F | Ht 70.0 in | Wt 168.6 lb

## 2019-04-27 DIAGNOSIS — Z86010 Personal history of colonic polyps: Secondary | ICD-10-CM | POA: Diagnosis not present

## 2019-04-27 DIAGNOSIS — E559 Vitamin D deficiency, unspecified: Secondary | ICD-10-CM

## 2019-04-27 DIAGNOSIS — Z8639 Personal history of other endocrine, nutritional and metabolic disease: Secondary | ICD-10-CM

## 2019-04-27 DIAGNOSIS — N401 Enlarged prostate with lower urinary tract symptoms: Secondary | ICD-10-CM | POA: Diagnosis not present

## 2019-04-27 DIAGNOSIS — I10 Essential (primary) hypertension: Secondary | ICD-10-CM

## 2019-04-27 DIAGNOSIS — E785 Hyperlipidemia, unspecified: Secondary | ICD-10-CM

## 2019-04-27 DIAGNOSIS — Z87442 Personal history of urinary calculi: Secondary | ICD-10-CM | POA: Diagnosis not present

## 2019-04-27 LAB — LIPID PANEL

## 2019-04-27 MED ORDER — ATORVASTATIN CALCIUM 10 MG PO TABS: 10.0000 mg | ORAL_TABLET | Freq: Every day | ORAL | 3 refills | Status: DC

## 2019-04-27 MED ORDER — AMLODIPINE BESYLATE 10 MG PO TABS: ORAL_TABLET | ORAL | 3 refills | Status: DC

## 2019-04-27 NOTE — Progress Notes (Signed)
Mike Wong is a 73 y.o. male who presents for annual wellness visit and follow-up on chronic medical conditions.  He has no particular concerns or complaints.  He has not had any prostate related symptoms.  He does have a history of colonic polyps and will be scheduled for colonoscopy at the end of the year.  Does have a history of renal stones with none recently.  He also has a previous history of vitamin D deficiency but has not had blood work done in quite some time.  He continues on Lipitor as well as amlodipine and is having no trouble with that.  Otherwise his social and past medical history and family history was negative.   Immunizations and Health Maintenance Immunization History  Administered Date(s) Administered  . Pneumococcal Conjugate-13 03/19/2015  . Pneumococcal Polysaccharide-23 10/22/2010  . Tdap 05/22/2008  . Zoster 10/22/2010   Health Maintenance Due  Topic Date Due  . PNA vac Low Risk Adult (2 of 2 - PPSV23) 03/18/2016  . TETANUS/TDAP  05/22/2018    Last colonoscopy: 10-28-16 Last PSA: 04/26/18 Dentist:04/26/19 Ophtho: 1.5 years ago Exercise: not much Other doctors caring for patient include: Mike Wong, Mike Wong [facility] Advanced Directives:yrs copy in chart   Depression screen:  See questionnaire below.     Depression screen Mike Wong 2/9 04/27/2019 04/25/2018 03/22/2017 03/19/2016 05/14/2014  Decreased Interest 0 0 0 0 0  Down, Depressed, Hopeless 0 0 0 0 0  PHQ - 2 Score 0 0 0 0 0    Fall Screen: See Questionaire below.   Fall Risk  04/27/2019 04/25/2018 03/22/2017 03/19/2016 05/14/2014  Falls in the past year? 0 No No No No    ADL screen:  See questionnaire below.  Functional Status Survey: Is the patient deaf or have difficulty hearing?: No Does the patient have difficulty seeing, even when wearing glasses/contacts?: No Does the patient have difficulty concentrating, remembering, or making decisions?: No Does the patient have difficulty walking or climbing  stairs?: No Does the patient have difficulty dressing or bathing?: No Does the patient have difficulty doing errands alone such as visiting a doctor's office or shopping?: No   Review of Systems  Constitutional: -, -unexpected weight change, -anorexia, -fatigue Allergy: -sneezing, -itching, -congestion Dermatology: denies changing moles, rash, lumps ENT: -runny nose, -ear pain, -sore throat,  Cardiology:  -chest pain, -palpitations, -orthopnea, Respiratory: -cough, -shortness of breath, -dyspnea on exertion, -wheezing,  Gastroenterology: -abdominal pain, -nausea, -vomiting, -diarrhea, -constipation, -dysphagia Hematology: -bleeding or bruising problems Musculoskeletal: -arthralgias, -myalgias, -joint swelling, -back pain, - Ophthalmology: -vision changes,  Urology: -dysuria, -difficulty urinating,  -urinary frequency, -urgency, incontinence Neurology: -, -numbness, , -memory loss, -falls, -dizziness    PHYSICAL EXAM:  BP 140/86 (BP Location: Left Arm, Patient Position: Sitting)   Pulse 65   Temp 98.1 F (36.7 C)   Ht 5\' 10"  (1.778 m)   Wt 168 lb 9.6 oz (76.5 kg)   SpO2 95%   BMI 24.19 kg/m   General Appearance: Alert, cooperative, no distress, appears stated age Head: Normocephalic, without obvious abnormality, atraumatic Eyes: PERRL, conjunctiva/corneas clear, EOM's intact, fundi benign Ears: Normal TM's and external ear canals Nose: Nares normal, mucosa normal, no drainage or sinus   tenderness Throat: Lips, mucosa, and tongue normal; teeth and gums normal Neck: Supple, no lymphadenopathy, thyroid:no enlargement/tenderness/nodules; no carotid bruit or JVD Lungs: Clear to auscultation bilaterally without wheezes, rales or ronchi; respirations unlabored Heart: Regular rate and rhythm, S1 and S2 normal, no murmur, rub or gallop Abdomen: Soft,  non-tender, nondistended, normoactive bowel sounds, no masses, no hepatosplenomegaly Extremities: No clubbing, cyanosis or  edema Pulses: 2+ and symmetric all extremities Skin: Skin color, texture, turgor normal, no rashes or lesions Lymph nodes: Cervical, supraclavicular, and axillary nodes normal Neurologic: CNII-XII intact, normal strength, sensation and gait; reflexes 2+ and symmetric throughout   Psych: Normal mood, affect, hygiene and grooming  ASSESSMENT/PLAN: Essential hypertension - Plan: CBC with Differential/Platelet, Comprehensive metabolic panel, amLODipine (NORVASC) 10 MG tablet, continue on present medication regimen.  History of renal stone - Plan: No change  History of colonic polyps - Plan: CBC with Differential/Platelet, Comprehensive metabolic panel, follow-up colonoscopy in December.  Benign prostatic hyperplasia with lower urinary tract symptoms, symptom details unspecified - Plan: Continue to monitor  History of vitamin D deficiency - Plan: Vitamin D level ordered.  Hyperlipidemia with target LDL less than 130 - Plan: Lipid panel, atorvastatin (LIPITOR) 10 MG tablet, continue present medication.  Vitamin D deficiency - Plan: VITAMIN D 25 Hydroxy (Vit-D Deficiency, Fractures), encouraged her to continue on vitamin D preparation.   recommended at least 30 minutes of aerobic activity at least 5 days/week;  Immunization recommendations discussed.  Colonoscopy recommendations reviewed.   Medicare Attestation I have personally reviewed: The patient's medical and social history Their use of alcohol, tobacco or illicit drugs Their current medications and supplements The patient's functional ability including ADLs,fall risks, home safety risks, cognitive, and hearing and visual impairment Diet and physical activities Evidence for depression or mood disorders  The patient's weight, height, and BMI have been recorded in the chart.  I have made referrals, counseling, and provided education to the patient based on review of the above and I have provided the patient with a written personalized  care plan for preventive services.     Mike Alexanders, MD   04/27/2019

## 2019-04-27 NOTE — Patient Instructions (Signed)
  Mike Wong , Thank you for taking time to come for your Medicare Wellness Visit. I appreciate your ongoing commitment to your health goals. Please review the following plan we discussed and let me know if I can assist you in the future.   These are the goals we discussed: Continue present medications.  Encouraged increase physical activity. This is a list of the screening recommended for you and due dates:  Health Maintenance  Topic Date Due  . Tetanus Vaccine  05/22/2018  . Flu Shot  06/17/2019  . Colon Cancer Screening  10/29/2019  .  Hepatitis C: One time screening is recommended by Center for Disease Control  (CDC) for  adults born from [birth year] through 1963.   Completed  . Pneumonia vaccines  Discontinued

## 2019-04-28 LAB — CBC WITH DIFFERENTIAL/PLATELET
Basophils Absolute: 0 10*3/uL (ref 0.0–0.2)
Basos: 0 %
EOS (ABSOLUTE): 0.1 10*3/uL (ref 0.0–0.4)
Eos: 2 %
Hematocrit: 40.3 % (ref 37.5–51.0)
Hemoglobin: 13.8 g/dL (ref 13.0–17.7)
Immature Grans (Abs): 0 10*3/uL (ref 0.0–0.1)
Immature Granulocytes: 0 %
Lymphocytes Absolute: 1 10*3/uL (ref 0.7–3.1)
Lymphs: 18 %
MCH: 32.3 pg (ref 26.6–33.0)
MCHC: 34.2 g/dL (ref 31.5–35.7)
MCV: 94 fL (ref 79–97)
Monocytes Absolute: 0.4 10*3/uL (ref 0.1–0.9)
Monocytes: 7 %
Neutrophils Absolute: 4.2 10*3/uL (ref 1.4–7.0)
Neutrophils: 73 %
Platelets: 276 10*3/uL (ref 150–450)
RBC: 4.27 x10E6/uL (ref 4.14–5.80)
RDW: 12.8 % (ref 11.6–15.4)
WBC: 5.8 10*3/uL (ref 3.4–10.8)

## 2019-04-28 LAB — COMPREHENSIVE METABOLIC PANEL
ALT: 18 IU/L (ref 0–44)
AST: 21 IU/L (ref 0–40)
Albumin/Globulin Ratio: 2.3 — ABNORMAL HIGH (ref 1.2–2.2)
Albumin: 4.6 g/dL (ref 3.7–4.7)
Alkaline Phosphatase: 93 IU/L (ref 39–117)
BUN/Creatinine Ratio: 18 (ref 10–24)
BUN: 18 mg/dL (ref 8–27)
Bilirubin Total: 0.6 mg/dL (ref 0.0–1.2)
CO2: 23 mmol/L (ref 20–29)
Calcium: 9.5 mg/dL (ref 8.6–10.2)
Chloride: 105 mmol/L (ref 96–106)
Creatinine, Ser: 0.99 mg/dL (ref 0.76–1.27)
GFR calc Af Amer: 88 mL/min/{1.73_m2} (ref >59)
GFR calc non Af Amer: 76 mL/min/{1.73_m2} (ref >59)
Globulin, Total: 2 g/dL (ref 1.5–4.5)
Glucose: 101 mg/dL — ABNORMAL HIGH (ref 65–99)
Potassium: 3.9 mmol/L (ref 3.5–5.2)
Sodium: 142 mmol/L (ref 134–144)
Total Protein: 6.6 g/dL (ref 6.0–8.5)

## 2019-04-28 LAB — LIPID PANEL
Chol/HDL Ratio: 3.2 ratio (ref 0.0–5.0)
Cholesterol, Total: 202 mg/dL — ABNORMAL HIGH (ref 100–199)
HDL: 63 mg/dL (ref >39)
LDL Calculated: 114 mg/dL — ABNORMAL HIGH (ref 0–99)
Triglycerides: 124 mg/dL (ref 0–149)
VLDL Cholesterol Cal: 25 mg/dL (ref 5–40)

## 2019-04-28 LAB — VITAMIN D 25 HYDROXY (VIT D DEFICIENCY, FRACTURES): Vit D, 25-Hydroxy: 32.4 ng/mL (ref 30.0–100.0)

## 2019-05-04 ENCOUNTER — Other Ambulatory Visit: Payer: Self-pay

## 2019-09-08 ENCOUNTER — Encounter: Payer: Self-pay | Admitting: Gastroenterology

## 2019-10-17 ENCOUNTER — Ambulatory Visit (AMBULATORY_SURGERY_CENTER): Payer: Medicare Other | Admitting: *Deleted

## 2019-10-17 ENCOUNTER — Other Ambulatory Visit: Payer: Self-pay

## 2019-10-17 VITALS — Temp 97.1°F | Ht 70.0 in | Wt 171.0 lb

## 2019-10-17 DIAGNOSIS — Z86010 Personal history of colonic polyps: Secondary | ICD-10-CM

## 2019-10-17 DIAGNOSIS — Z1159 Encounter for screening for other viral diseases: Secondary | ICD-10-CM

## 2019-10-17 MED ORDER — SUPREP BOWEL PREP KIT 17.5-3.13-1.6 GM/177ML PO SOLN: 1.0000 | Freq: Once | ORAL | 0 refills | Status: AC

## 2019-10-17 NOTE — Progress Notes (Signed)

## 2019-10-26 ENCOUNTER — Other Ambulatory Visit: Payer: Self-pay | Admitting: Gastroenterology

## 2019-10-26 ENCOUNTER — Ambulatory Visit (INDEPENDENT_AMBULATORY_CARE_PROVIDER_SITE_OTHER): Payer: Medicare Other

## 2019-10-26 DIAGNOSIS — Z1159 Encounter for screening for other viral diseases: Secondary | ICD-10-CM

## 2019-10-27 LAB — SARS CORONAVIRUS 2 (TAT 6-24 HRS): SARS Coronavirus 2: NEGATIVE

## 2019-10-31 ENCOUNTER — Encounter: Payer: Self-pay | Admitting: Gastroenterology

## 2019-10-31 ENCOUNTER — Other Ambulatory Visit: Payer: Self-pay

## 2019-10-31 ENCOUNTER — Ambulatory Visit (AMBULATORY_SURGERY_CENTER): Payer: Medicare Other | Admitting: Gastroenterology

## 2019-10-31 VITALS — BP 126/83 | HR 49 | Temp 98.2°F | Resp 14 | Ht 70.0 in | Wt 171.0 lb

## 2019-10-31 DIAGNOSIS — K635 Polyp of colon: Secondary | ICD-10-CM | POA: Diagnosis not present

## 2019-10-31 DIAGNOSIS — D120 Benign neoplasm of cecum: Secondary | ICD-10-CM

## 2019-10-31 DIAGNOSIS — Z86010 Personal history of colonic polyps: Secondary | ICD-10-CM | POA: Diagnosis not present

## 2019-10-31 DIAGNOSIS — I10 Essential (primary) hypertension: Secondary | ICD-10-CM | POA: Diagnosis not present

## 2019-10-31 MED ORDER — SODIUM CHLORIDE 0.9 % IV SOLN: 500.0000 mL | Freq: Once | INTRAVENOUS | Status: DC

## 2019-10-31 NOTE — Op Note (Signed)
[facility] Patient Name: Mike Wong Procedure Date: 10/31/2019 9:45 AM MRN: [id number] Endoscopist: Remo Lipps P. Mike Wong , MD Age: 73 Referring MD:  Date of Birth: 08-May-1946 Gender: Male Account #: [id number] Procedure:                Colonoscopy Indications:              Surveillance: Personal history of adenomatous                            polyps on last colonoscopy 3 years ago Medicines:                Monitored Anesthesia Care Procedure:                Pre-Anesthesia Assessment:                           - Prior to the procedure, a History and Physical                            was performed, and patient medications and                            allergies were reviewed. The patient's tolerance of                            previous anesthesia was also reviewed. The risks                            and benefits of the procedure and the sedation                            options and risks were discussed with the patient.                            All questions were answered, and informed consent                            was obtained. Prior Anticoagulants: The patient has                            taken no previous anticoagulant or antiplatelet                            agents. ASA Grade Assessment: II - A patient with                            mild systemic disease. After reviewing the risks                            and benefits, the patient was deemed in                            satisfactory condition to undergo the procedure.  After obtaining informed consent, the colonoscope                            was passed under direct vision. Throughout the                            procedure, the patient's blood pressure, pulse, and                            oxygen saturations were monitored continuously. The                            Colonoscope was introduced through the anus and                            advanced to the  the cecum, identified by                            appendiceal orifice and ileocecal valve. The                            colonoscopy was performed without difficulty. The                            patient tolerated the procedure well. The quality                            of the bowel preparation was adequate. The                            ileocecal valve, appendiceal orifice, and rectum                            were photographed. Scope In: 9:49:30 AM Scope Out: 10:07:53 AM Scope Withdrawal Time: 0 hours 14 minutes 17 seconds  Total Procedure Duration: 0 hours 18 minutes 23 seconds  Findings:                 The perianal and digital rectal examinations were                            normal.                           Multiple small-mouthed diverticula were found in                            the sigmoid colon.                           A 3 mm polyp was found in the cecum. The polyp was                            sessile. The polyp was removed with a cold snare.  Resection and retrieval were complete.                           Internal hemorrhoids were found during retroflexion.                           The exam was otherwise without abnormality. Complications:            No immediate complications. Estimated blood loss:                            Minimal. Estimated Blood Loss:     Estimated blood loss was minimal. Impression:               - Diverticulosis in the sigmoid colon.                           - One 3 mm polyp in the cecum, removed with a cold                            snare. Resected and retrieved.                           - Internal hemorrhoids.                           - The examination was otherwise normal. Recommendation:           - Patient has a contact number available for                            emergencies. The signs and symptoms of potential                            delayed complications were discussed with the                             patient. Return to normal activities tomorrow.                            Written discharge instructions were provided to the                            patient.                           - Resume previous diet.                           - Continue present medications.                           - Await pathology results. Anticipate repeat                            colonoscopy in 5 years for surveillance purposes  given multiple polyps removed 3 years ago Mike Wong. Mike Guderian, MD 10/31/2019 10:12:03 AM This report has been signed electronically.

## 2019-10-31 NOTE — Patient Instructions (Signed)
YOU HAD AN ENDOSCOPIC PROCEDURE TODAY AT THE [facility]:   Refer to the procedure report that was given to you for any specific questions about what was found during the examination.  If the procedure report does not answer your questions, please call your gastroenterologist to clarify.  If you requested that your care partner not be given the details of your procedure findings, then the procedure report has been included in a sealed envelope for you to review at your convenience later.  YOU SHOULD EXPECT: Some feelings of bloating in the abdomen. Passage of more gas than usual.  Walking can help get rid of the air that was put into your GI tract during the procedure and reduce the bloating. If you had a lower endoscopy (such as a colonoscopy or flexible sigmoidoscopy) you may notice spotting of blood in your stool or on the toilet paper. If you underwent a bowel prep for your procedure, you may not have a normal bowel movement for a few days.  Please Note:  You might notice some irritation and congestion in your nose or some drainage.  This is from the oxygen used during your procedure.  There is no need for concern and it should clear up in a day or so.  SYMPTOMS TO REPORT IMMEDIATELY:   Following lower endoscopy (colonoscopy or flexible sigmoidoscopy):  Excessive amounts of blood in the stool  Significant tenderness or worsening of abdominal pains  Swelling of the abdomen that is new, acute  Fever of 100F or higher   For urgent or emergent issues, a gastroenterologist can be reached at any hour by calling [phone].   DIET:  We do recommend a small meal at first, but then you may proceed to your regular diet.  Drink plenty of fluids but you should avoid alcoholic beverages for 24 hours.  ACTIVITY:  You should plan to take it easy for the rest of today and you should NOT DRIVE or use heavy machinery until tomorrow (because of the sedation medicines used during the test).     FOLLOW UP: Our staff will call the number listed on your records 48-72 hours following your procedure to check on you and address any questions or concerns that you may have regarding the information given to you following your procedure. If we do not reach you, we will leave a message.  We will attempt to reach you two times.  During this call, we will ask if you have developed any symptoms of COVID 19. If you develop any symptoms (ie: fever, flu-like symptoms, shortness of breath, cough etc.) before then, please call [phone].  If you test positive for Covid 19 in the 2 weeks post procedure, please call and report this information to us.    If any biopsies were taken you will be contacted by phone or by letter within the next 1-3 weeks.  Please call us at [phone] if you have not heard about the biopsies in 3 weeks.    SIGNATURES/CONFIDENTIALITY: You and/or your care partner have signed paperwork which will be entered into your electronic medical record.  These signatures attest to the fact that that the information above on your After Visit Summary has been reviewed and is understood.  Full responsibility of the confidentiality of this discharge information lies with you and/or your care-partner.     Handouts were given to you on polyps, diverticulosis, and hemorrhoids. You may resume your current medications today. Await biopsy results. Please call if   any questions or concerns.   

## 2019-10-31 NOTE — Progress Notes (Signed)
Report given to PACU, vss 

## 2019-10-31 NOTE — Progress Notes (Signed)
VS-DT Temp-LC  Pt's states no medical or surgical changes since previsit or office visit.  

## 2019-10-31 NOTE — Progress Notes (Signed)
No problems noted in the recovery room. maw 

## 2019-11-02 ENCOUNTER — Telehealth: Payer: Self-pay

## 2019-11-02 NOTE — Telephone Encounter (Signed)
LVM

## 2019-11-07 ENCOUNTER — Encounter: Payer: Self-pay | Admitting: *Deleted

## 2020-01-15 ENCOUNTER — Ambulatory Visit: Payer: Medicare Other | Attending: Internal Medicine

## 2020-01-15 DIAGNOSIS — Z23 Encounter for immunization: Secondary | ICD-10-CM | POA: Insufficient documentation

## 2020-01-15 NOTE — Progress Notes (Signed)
   Covid-19 Vaccination Clinic  Name:  Mike Wong    MRN: [id number] DOB: 07/07/1946  01/15/2020  [name] was observed post Covid-19 immunization for 15 minutes without incidence. He was provided with Vaccine Information Sheet and instruction to access the V-Safe system.   [name] was instructed to call 911 with any severe reactions post vaccine: Marland Kitchen Difficulty breathing  . Swelling of your face and throat  . A fast heartbeat  . A bad rash all over your body  . Dizziness and weakness    Immunizations Administered    Name Date Dose VIS Date Route   Pfizer COVID-19 Vaccine 01/15/2020  8:47 AM 0.3 mL 10/27/2019 Intramuscular   Manufacturer: [place]   Lot: [id number]   [place]: [id number]

## 2020-02-13 ENCOUNTER — Ambulatory Visit: Payer: Medicare Other | Attending: Internal Medicine

## 2020-02-13 DIAGNOSIS — Z23 Encounter for immunization: Secondary | ICD-10-CM

## 2020-02-13 NOTE — Progress Notes (Signed)
   Covid-19 Vaccination Clinic  Name:  Shahzaib Prichard    MRN: [id number] DOB: [birth year]  02/13/2020  Mr. Lizardi was observed post Covid-19 immunization for 15 minutes without incident. He was provided with Vaccine Information Sheet and instruction to access the V-Safe system.   Mr. Barnaba was instructed to call 911 with any severe reactions post vaccine: Marland Kitchen Difficulty breathing  . Swelling of face and throat  . A fast heartbeat  . A bad rash all over body  . Dizziness and weakness   Immunizations Administered    Name Date Dose VIS Date Route   Pfizer COVID-19 Vaccine 02/13/2020  8:56 AM 0.3 mL 10/27/2019 Intramuscular   Manufacturer: [place]   Lot: [id number]   [place]: [id number]

## 2020-04-05 ENCOUNTER — Other Ambulatory Visit: Payer: Self-pay | Admitting: Family Medicine

## 2020-04-05 DIAGNOSIS — E785 Hyperlipidemia, unspecified: Secondary | ICD-10-CM

## 2020-04-17 DIAGNOSIS — H938X3 Other specified disorders of ear, bilateral: Secondary | ICD-10-CM | POA: Diagnosis not present

## 2020-04-29 ENCOUNTER — Ambulatory Visit: Payer: Medicare Other | Admitting: Family Medicine

## 2020-05-23 ENCOUNTER — Ambulatory Visit (INDEPENDENT_AMBULATORY_CARE_PROVIDER_SITE_OTHER): Payer: Medicare Other | Admitting: Family Medicine

## 2020-05-23 ENCOUNTER — Other Ambulatory Visit: Payer: Self-pay

## 2020-05-23 ENCOUNTER — Encounter: Payer: Self-pay | Admitting: Family Medicine

## 2020-05-23 VITALS — BP 110/70 | HR 71 | Temp 97.5°F | Wt 169.2 lb

## 2020-05-23 DIAGNOSIS — R438 Other disturbances of smell and taste: Secondary | ICD-10-CM

## 2020-05-23 DIAGNOSIS — K30 Functional dyspepsia: Secondary | ICD-10-CM

## 2020-05-23 NOTE — Patient Instructions (Signed)
Take 2 Prilosec daily for the next week and will going to do the blood work so we will see what that shows as well

## 2020-05-23 NOTE — Progress Notes (Signed)
   Subjective:    Patient ID: Mike Wong, male    DOB: 06-25-1946, 74 y.o.   MRN: [id number]  HPI He complains of a 1 month history of a bad taste in his mouth as well as an upset stomach after he eats.  He had no vomiting, diarrhea, constipation, fever, chills.  He has been under no undue stress.  He has not tried any medications for this.  He did have a colonoscopy done last year.  He states he feels as if his weight is down however the record does not indicate that.   Review of Systems     Objective:   Physical Exam Alert and in no distress. Tympanic membranes and canals are normal. Pharyngeal area is normal. Neck is supple without adenopathy or thyromegaly. Cardiac exam shows a regular sinus rhythm without murmurs or gallops. Lungs are clear to auscultation.  Abdominal exam shows no masses or tenderness with normal bowel sounds Weight is down 2 pounds.       Assessment & Plan:  Upset stomach - Plan: CBC with Differential/Platelet, Comprehensive metabolic panel  Bad taste in mouth - Plan: CBC with Differential/Platelet, Comprehensive metabolic panel Recommend he take 40 mg Prilosec daily for the next week and see what this does to quiet things down.

## 2020-05-24 ENCOUNTER — Other Ambulatory Visit: Payer: Self-pay | Admitting: Family Medicine

## 2020-05-24 DIAGNOSIS — I10 Essential (primary) hypertension: Secondary | ICD-10-CM

## 2020-05-24 LAB — CBC WITH DIFFERENTIAL/PLATELET
Basophils Absolute: 0 10*3/uL (ref 0.0–0.2)
Basos: 1 %
EOS (ABSOLUTE): 0.1 10*3/uL (ref 0.0–0.4)
Eos: 1 %
Hematocrit: 42.1 % (ref 37.5–51.0)
Hemoglobin: 13.9 g/dL (ref 13.0–17.7)
Immature Grans (Abs): 0 10*3/uL (ref 0.0–0.1)
Immature Granulocytes: 0 %
Lymphocytes Absolute: 1.1 10*3/uL (ref 0.7–3.1)
Lymphs: 18 %
MCH: 30.8 pg (ref 26.6–33.0)
MCHC: 33 g/dL (ref 31.5–35.7)
MCV: 93 fL (ref 79–97)
Monocytes Absolute: 0.5 10*3/uL (ref 0.1–0.9)
Monocytes: 7 %
Neutrophils Absolute: 4.8 10*3/uL (ref 1.4–7.0)
Neutrophils: 73 %
Platelets: 274 10*3/uL (ref 150–450)
RBC: 4.51 x10E6/uL (ref 4.14–5.80)
RDW: 12.5 % (ref 11.6–15.4)
WBC: 6.5 10*3/uL (ref 3.4–10.8)

## 2020-05-24 LAB — COMPREHENSIVE METABOLIC PANEL
ALT: 24 IU/L (ref 0–44)
AST: 20 IU/L (ref 0–40)
Albumin/Globulin Ratio: 1.9 (ref 1.2–2.2)
Albumin: 4.4 g/dL (ref 3.7–4.7)
Alkaline Phosphatase: 99 IU/L (ref 48–121)
BUN/Creatinine Ratio: 14 (ref 10–24)
BUN: 16 mg/dL (ref 8–27)
Bilirubin Total: 0.2 mg/dL (ref 0.0–1.2)
CO2: 22 mmol/L (ref 20–29)
Calcium: 9.6 mg/dL (ref 8.6–10.2)
Chloride: 102 mmol/L (ref 96–106)
Creatinine, Ser: 1.13 mg/dL (ref 0.76–1.27)
GFR calc Af Amer: 75 mL/min/{1.73_m2} (ref >59)
GFR calc non Af Amer: 65 mL/min/{1.73_m2} (ref >59)
Globulin, Total: 2.3 g/dL (ref 1.5–4.5)
Glucose: 117 mg/dL — ABNORMAL HIGH (ref 65–99)
Potassium: 4.1 mmol/L (ref 3.5–5.2)
Sodium: 138 mmol/L (ref 134–144)
Total Protein: 6.7 g/dL (ref 6.0–8.5)

## 2020-06-25 ENCOUNTER — Ambulatory Visit (INDEPENDENT_AMBULATORY_CARE_PROVIDER_SITE_OTHER): Payer: Medicare Other | Admitting: Family Medicine

## 2020-06-25 ENCOUNTER — Other Ambulatory Visit: Payer: Self-pay

## 2020-06-25 ENCOUNTER — Ambulatory Visit
Admission: RE | Admit: 2020-06-25 | Discharge: 2020-06-25 | Disposition: A | Discharge status: Home / Self Care | Payer: Medicare Other | Source: Ambulatory Visit | Attending: Family Medicine | Admitting: Family Medicine

## 2020-06-25 ENCOUNTER — Encounter: Payer: Self-pay | Admitting: Family Medicine

## 2020-06-25 VITALS — BP 120/82 | HR 69 | Temp 97.8°F | Ht 68.75 in | Wt 163.8 lb

## 2020-06-25 DIAGNOSIS — M4186 Other forms of scoliosis, lumbar region: Secondary | ICD-10-CM | POA: Diagnosis not present

## 2020-06-25 DIAGNOSIS — Z8639 Personal history of other endocrine, nutritional and metabolic disease: Secondary | ICD-10-CM | POA: Diagnosis not present

## 2020-06-25 DIAGNOSIS — G8929 Other chronic pain: Secondary | ICD-10-CM

## 2020-06-25 DIAGNOSIS — Z87442 Personal history of urinary calculi: Secondary | ICD-10-CM | POA: Diagnosis not present

## 2020-06-25 DIAGNOSIS — L309 Dermatitis, unspecified: Secondary | ICD-10-CM

## 2020-06-25 DIAGNOSIS — R634 Abnormal weight loss: Secondary | ICD-10-CM

## 2020-06-25 DIAGNOSIS — N401 Enlarged prostate with lower urinary tract symptoms: Secondary | ICD-10-CM | POA: Diagnosis not present

## 2020-06-25 DIAGNOSIS — M5136 Other intervertebral disc degeneration, lumbar region: Secondary | ICD-10-CM | POA: Diagnosis not present

## 2020-06-25 DIAGNOSIS — Z86010 Personal history of colonic polyps: Secondary | ICD-10-CM | POA: Diagnosis not present

## 2020-06-25 DIAGNOSIS — M533 Sacrococcygeal disorders, not elsewhere classified: Secondary | ICD-10-CM | POA: Diagnosis not present

## 2020-06-25 DIAGNOSIS — E785 Hyperlipidemia, unspecified: Secondary | ICD-10-CM | POA: Diagnosis not present

## 2020-06-25 DIAGNOSIS — M545 Low back pain, unspecified: Secondary | ICD-10-CM

## 2020-06-25 DIAGNOSIS — I10 Essential (primary) hypertension: Secondary | ICD-10-CM

## 2020-06-25 DIAGNOSIS — Z0000 Encounter for general adult medical examination without abnormal findings: Secondary | ICD-10-CM

## 2020-06-25 DIAGNOSIS — M1990 Unspecified osteoarthritis, unspecified site: Secondary | ICD-10-CM | POA: Diagnosis not present

## 2020-06-25 DIAGNOSIS — M48061 Spinal stenosis, lumbar region without neurogenic claudication: Secondary | ICD-10-CM | POA: Diagnosis not present

## 2020-06-25 MED ORDER — BETAMETHASONE DIPROPIONATE 0.05 % EX CREA: TOPICAL_CREAM | Freq: Two times a day (BID) | CUTANEOUS | 0 refills | Status: DC

## 2020-06-25 NOTE — Progress Notes (Signed)
Mike Wong is a 74 y.o. male who presents for annual wellness visit,CPE and follow-up on chronic medical conditions.  He continues to be concerned about weight loss. He did have a colonoscopy in 2020. He does complain of some slight early satiety but states that the Prilosec has helped and he is having less difficulty with that. No nausea, vomiting, appetite change, abdominal pain, cough, congestion. He does have underlying BPH and does have nocturia x1-2 but seems to be okay with that. He does complain of back stiffness but states it is not interfere with any of his physical activities. He also complains of some arthritic type symptoms in his hands again not enough to cause any change in his ADLs. He does have a history of eczema and would like a renewal of his betamethasone which he says helps keep this under good control. He continues on amlodipine as well as atorvastatin and having no difficulty with that. He has remote history of renal stones but none in the last several years. He also takes OTC meds. His home life and work are going well.   Immunizations and Health Maintenance Immunization History  Administered Date(s) Administered  . PFIZER SARS-COV-2 Vaccination 01/15/2020, 02/13/2020  . Pneumococcal Conjugate-13 03/19/2015  . Pneumococcal Polysaccharide-23 10/22/2010  . Tdap 05/22/2008  . Zoster 10/22/2010   Health Maintenance Due  Topic Date Due  . TETANUS/TDAP  05/22/2018  . INFLUENZA VACCINE  06/16/2020    Last colonoscopy: 10/31/19 Last PSA: 04/25/18 Dentist: Q six months Ophtho:three years ago Exercise: staying active at least 30 min a day  Other doctors caring for patient include: Dr. Havery [facility]os [facility]  Advanced Directives: Does Patient Have a Medical Advance Directive?: Yes Type of Advance Directive: [place] Does patient want to make changes to medical advance directive?: No - Patient declined Copy of [place] in Chart?: Yes -  validated most recent copy scanned in chart (See row information)  Depression screen:  See questionnaire below.     Depression screen [facility] 2/9 05/23/2020 04/27/2019 04/25/2018 03/22/2017 03/19/2016  Decreased Interest 0 0 0 0 0  Down, Depressed, Hopeless 0 0 0 0 0  PHQ - 2 Score 0 0 0 0 0    Fall Screen: See Questionaire below.   Fall Risk  06/25/2020 05/23/2020 04/27/2019 04/25/2018 03/22/2017  Falls in the past year? 0 0 0 No No  Risk for fall due to : No Fall Risks - - - -    ADL screen:  See questionnaire below.  Functional Status Survey: Is the patient deaf or have difficulty hearing?: No Does the patient have difficulty seeing, even when wearing glasses/contacts?: No Does the patient have difficulty concentrating, remembering, or making decisions?: No Does the patient have difficulty walking or climbing stairs?: No Does the patient have difficulty dressing or bathing?: No Does the patient have difficulty doing errands alone such as visiting a doctor's office or shopping?: No   Review of Systems  Constitutional: -, -unexpected weight change, -anorexia, -fatigue Allergy: -sneezing, -itching, -congestion Dermatology: denies changing moles, rash, lumps ENT: -runny nose, -ear pain, -sore throat,  Cardiology:  -chest pain, -palpitations, -orthopnea, Respiratory: -cough, -shortness of breath, -dyspnea on exertion, -wheezing,  Gastroenterology: -abdominal pain, -nausea, -vomiting, -diarrhea, -constipation, -dysphagia Hematology: -bleeding or bruising problems Musculoskeletal: -arthralgias, -myalgias, -joint swelling, -back pain, - Ophthalmology: -vision changes,  Urology: -dysuria, -difficulty urinating,  -urinary frequency, -urgency, incontinence Neurology: -, -numbness, , -memory loss, -falls, -dizziness    PHYSICAL EXAM:  General Appearance: Alert, cooperative, no distress, appears stated age Head: Normocephalic, without obvious abnormality, atraumatic Eyes: PERRL,  conjunctiva/corneas clear, EOM's intact, fundi benign Ears: Normal TM's and external ear canals Nose: Nares normal, mucosa normal, no drainage or sinus   tenderness Throat: Lips, mucosa, and tongue normal; teeth and gums normal Neck: Supple, no lymphadenopathy, thyroid:no enlargement/tenderness/nodules; no carotid bruit or JVD Lungs: Clear to auscultation bilaterally without wheezes, rales or ronchi; respirations unlabored Heart: Regular rate and rhythm, S1 and S2 normal, no murmur, rub or gallop Abdomen: Soft, non-tender, nondistended, normoactive bowel sounds, no masses, no hepatosplenomegaly Extremities: No clubbing, cyanosis or edema Pulses: 2+ and symmetric all extremities Skin: Skin color, texture, turgor normal, no rashes or lesions Lymph nodes: Cervical, supraclavicular, and axillary nodes normal Neurologic: CNII-XII intact, normal strength, sensation and gait; reflexes 2+ and symmetric throughout   Psych: Normal mood, affect, hygiene and grooming  ASSESSMENT/PLAN: Routine general medical examination at a health care facility  Essential hypertension  History of colonic polyps  Hyperlipidemia with target LDL less than 130 - Plan: Lipid panel  History of vitamin D deficiency  Benign prostatic hyperplasia with lower urinary tract symptoms, symptom details unspecified  History of renal stone  Eczema, unspecified type - Plan: betamethasone dipropionate 0.05 % cream  Chronic midline low back pain without sciatica - Plan: DG Lumbar Spine Complete  Arthritis  Weight loss He is to return here in 2 months for recheck on his arthritis. Recommend conservative care for knee arthritis and possible referral to physical therapy for good back rehab program. He will call if he has difficulty with kidney stones. Continue on present medications.    Immunization recommendations discussed.  Colonoscopy recommendations reviewed.   Medicare Attestation I have personally reviewed: The  patient's medical and social history Their use of alcohol, tobacco or illicit drugs Their current medications and supplements The patient's functional ability including ADLs,fall risks, home safety risks, cognitive, and hearing and visual impairment Diet and physical activities Evidence for depression or mood disorders  The patient's weight, height, and BMI have been recorded in the chart.  I have made referrals, counseling, and provided education to the patient based on review of the above and I have provided the patient with a written personalized care plan for preventive services.     [name], MD   06/25/2020

## 2020-06-26 ENCOUNTER — Other Ambulatory Visit: Payer: Self-pay

## 2020-06-26 DIAGNOSIS — M545 Low back pain, unspecified: Secondary | ICD-10-CM

## 2020-06-26 LAB — LIPID PANEL
Chol/HDL Ratio: 2.8 ratio (ref 0.0–5.0)
Cholesterol, Total: 177 mg/dL (ref 100–199)
HDL: 63 mg/dL (ref >39)
LDL Chol Calc (NIH): 94 mg/dL (ref 0–99)
Triglycerides: 112 mg/dL (ref 0–149)
VLDL Cholesterol Cal: 20 mg/dL (ref 5–40)

## 2020-07-15 ENCOUNTER — Other Ambulatory Visit: Payer: Self-pay | Admitting: Family Medicine

## 2020-07-15 DIAGNOSIS — E785 Hyperlipidemia, unspecified: Secondary | ICD-10-CM

## 2020-07-16 ENCOUNTER — Encounter: Payer: Self-pay | Admitting: Physical Therapy

## 2020-07-16 ENCOUNTER — Ambulatory Visit: Payer: Medicare Other | Attending: Family Medicine | Admitting: Physical Therapy

## 2020-07-16 ENCOUNTER — Other Ambulatory Visit: Payer: Self-pay

## 2020-07-16 DIAGNOSIS — G8929 Other chronic pain: Secondary | ICD-10-CM | POA: Diagnosis not present

## 2020-07-16 DIAGNOSIS — M6281 Muscle weakness (generalized): Secondary | ICD-10-CM | POA: Diagnosis not present

## 2020-07-16 DIAGNOSIS — M545 Low back pain, unspecified: Secondary | ICD-10-CM

## 2020-07-16 NOTE — Patient Instructions (Signed)
Access Code: [email or web address] URL: [email or web address] Date: 07/16/2020 Prepared by: Hilda Blades  Exercises Supine Bridge - 3 x weekly - 2 sets - 10 reps - 3 seconds hold Dead Bug - 3 x weekly - 2 sets - 10 reps 90/90 Lower Trunk Rotation - 1 x daily - 2 sets - 10 reps Clamshell with Resistance - 3 x weekly - 2 sets - 15 reps Side Plank on Knees - 3 x weekly - 5 reps - 10 seconds hold Bird Dog - 3 x weekly - 2 sets - 10 reps - 3 seconds hold

## 2020-07-16 NOTE — Therapy (Signed)
[place], [place], [address] Phone: [phone]   Fax:  [phone]  Physical Therapy Evaluation  Patient Details  Name: Mike Wong MRN: [id number] Date of Birth: Apr 18, 1946 Referring Provider (PT): Low back pain   Encounter Date: 07/16/2020   PT End of Session - 07/16/20 1454    Visit Number 1    Number of Visits 6    Date for PT Re-Evaluation 08/27/20    Authorization Type MCR    Progress Note Due on Visit 10    PT Start Time 2355    PT Stop Time 1530    PT Time Calculation (min) 45 min    Activity Tolerance Patient tolerated treatment well    Behavior During Therapy [facility] for tasks assessed/performed           Past Medical History:  Diagnosis Date  . 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 Arthritis   . Clotting disorder (HCC)    leg artery aneurysm   . Finger fracture, right    5th finger  . High cholesterol   . Hypertension   . Kidney stones     Past Surgical History:  Procedure Laterality Date  . BYPASS GRAFT POPLITEAL TO POPLITEAL Right 03/03/2016   Procedure: BYPASS RIGHT ABOVE POPLITEAL TO BELOW POPLITEAL USING REVERSED RIGHT GREATER SAPHENOUS VEIN;  Surgeon: Elam Dutch, MD;  Location: [place];  Service: Vascular;  Laterality: Right;  . COLONOSCOPY  11/04/06  . ELECTROCARDIOGRAM  03/16/03  . EMBOLECTOMY Right 03/03/2016   Procedure: RIGHT ANTERIOR TIBIAL EMBOLECTOMY;  Surgeon: Elam Dutch, MD;  Location: [place];  Service: Vascular;  Laterality: Right;  . FALSE ANEURYSM REPAIR Right 03/03/2016   Procedure: LIGATION RIGHT  POPLITEAL ARTERY ANEURYSM;  Surgeon: Elam Dutch, MD;  Location: [place];  Service: Vascular;  Laterality: Right;  . LITHOTRIPSY    . LITHOTRIPSY    . OPEN REDUCTION INTERNAL FIXATION (ORIF) METACARPAL Right 10/08/2015   Procedure: OPEN REDUCTION INTERNAL FIXATION (ORIF) METACARPAL RIGHT RING FINGER;  Surgeon: Daryll Brod, MD;   Location: [place];  Service: Orthopedics;  Laterality: Right;  . POLYPECTOMY      There were no vitals filed for this visit.    Subjective Assessment - 07/16/20 1447    Subjective Patient reports back discomfort for a couple of years. He doesn't feel limited because of his discomfort. States during the day he doesn't feel it, it is mainly in the morning when he gets up. The discomfort in the morning lasts for about an hour and it improves with movement.    Limitations Lifting;House hold activities    How long can you sit comfortably? No limitation    How long can you stand comfortably? No limitation    How long can you walk comfortably? No limitation    Diagnostic tests X-ray    Patient Stated Goals Improve back discomfort and stiffness in the morning    Currently in Pain? Yes    Pain Score 0-No pain    Pain Location Back    Pain Orientation Lower    Pain Descriptors / Indicators Aching;Tightness    Pain Type Chronic pain    Pain Onset More than a month ago    Pain Frequency Intermittent    Aggravating Factors  Getting up in the morning    Pain Relieving Factors Stretching and movement    Effect of Pain on Daily Activities Patient denies  any limitation with activity              [facility] PT Assessment - 07/16/20 0001      Assessment   Medical Diagnosis [name], MD    Referring Provider (PT) Low back pain    Onset Date/Surgical Date --   patient reports discomfort for years   Next MD Visit Not scheduled    Prior Therapy None      Precautions   Precautions None      Restrictions   Weight Bearing Restrictions No      Balance Screen   Has the patient fallen in the past 6 months No    Has the patient had a decrease in activity level because of a fear of falling?  No    Is the patient reluctant to leave their home because of a fear of falling?  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, working in garden      Cognition   Overall Cognitive Status Within Functional Limits for tasks assessed      Observation/Other Assessments   Observations Patient appears in no apparent distress    Focus on Therapeutic Outcomes (FOTO)  28% limitation      Sensation   Light Touch Appears Intact      Coordination   Gross Motor Movements are Fluid and Coordinated Yes      Posture/Postural Control   Posture Comments Patient does exhibit slight scoliotic curve with forward bend but non-symptomatic, increased lumbar lordosis      ROM / Strength   AROM / PROM / Strength AROM;PROM;Strength      AROM   AROM Assessment Site Lumbar    Lumbar Flexion WFL    Lumbar Extension WFL - increased bilateral low back stiffness    Lumbar - Right Side Bend WFL - increased right lower back stiffness    Lumbar - Left Side Bend WFL - increased left lower back stiffness    Lumbar - Right Rotation WFL    Lumbar - Left Rotation WFL      PROM   Overall PROM Comments Hip PROM grossly WFL and non-painful      Strength   Overall Strength Comments Core strength grossly 4/5 MMT    Strength Assessment Site Hip;Knee    Right/Left Hip Right;Left    Right Hip Flexion 4+/5    Right Hip Extension 4/5    Right Hip ABduction 4/5    Left Hip Flexion 4+/5    Left Hip Extension 4/5    Left Hip ABduction 4/5    Right/Left Knee Right;Left    Right Knee Flexion 5/5    Right Knee Extension 5/5    Left Knee Flexion 5/5    Left Knee Extension 5/5      Flexibility   Soft Tissue Assessment /Muscle Length yes    Hamstrings WFL    Quadriceps WFL    Piriformis WFL      Palpation   Spinal mobility Slight hypomobility throughout lumbar region with patient reported stiffness, non-painful    Palpation comment Non-TTP      Special Tests   Other special tests Radicular testing negative      Transfers   Transfers  Independent with all Transfers  Objective measurements completed on examination: See above findings.       [place] Adult PT Treatment/Exercise - 07/16/20 0001      Exercises   Exercises Lumbar      Lumbar Exercises: Supine   Dead Bug 10 reps    Dead Bug Limitations keeping knees bent    Bridge 10 reps;3 seconds    Other Supine Lumbar Exercises LTR with feet elevated x 5 each direction      Lumbar Exercises: Sidelying   Clam 15 reps    Clam Limitations yellow band    Other Sidelying Lumbar Exercises Side plank on knees 3 x 10 sec      Lumbar Exercises: Quadruped   Opposite Arm/Leg Raise 10 reps;3 seconds                  PT Education - 07/16/20 1454    Education Details Exam findings, POC, HEP    Person(s) Educated Patient    Methods Explanation;Demonstration;Tactile cues;Verbal cues;Handout    Comprehension Verbalized understanding;Returned demonstration;Verbal cues required;Tactile cues required;Need further instruction            PT Short Term Goals - 07/16/20 1455      PT SHORT TERM GOAL #1   Title Patient will be I with initial HEP to progress with PT    Time 3    Period Weeks    Status New    Target Date 08/06/20             PT Long Term Goals - 07/16/20 1455      PT LONG TERM GOAL #1   Title Patient will be I with final HEP to maintain progress from PT    Time 6    Period Weeks    Status New    Target Date 08/27/20      PT LONG TERM GOAL #2   Title Patient will exhibit improved core and hip strength to >/= 4+/5 MMT to improve lifting and abilityto perform household tasks    Time 6    Period Weeks    Status New    Target Date 08/27/20      PT LONG TERM GOAL #3   Title Patient will demonstrated AROM without any stiffness to improve mobility in the morning and getting dressed    Time 6    Period Weeks    Status New    Target Date 08/27/20      PT LONG TERM GOAL #4   Title Patient will report  improved functional level to </= 23% limitation on FOTO    Time 6    Period Weeks    Status New    Target Date 08/27/20                  Plan - 07/16/20 1456    Clinical Impression Statement Patient reports to PT with chronic low back pain that mainly occurs in the morning and improves with activity. He does exhibit good lumbar AROM but reports stiffness with extension and side bending, slight hypomobility with PAs of lumbar region, gross core and hip strength deficit with decreased lumbopelvic control. His symptoms seems to be facet related with no radicular symptoms noted. He was provided exercises to initiate core and hip strengthening and patient would benefit from continued skilled PT to progress his strength and mobility to reduce discomfort with activity and stiffness in the morning.    Personal Factors and Comorbidities Fitness;Time since onset of injury/illness/exacerbation  Examination-Activity Limitations Lift;Sleep;Carry    Examination-Participation Restrictions Community Activity;Yard Work;Occupation    Stability/Clinical Decision Making Stable/Uncomplicated    Clinical Decision Making Low    Rehab Potential Good    PT Frequency 1x / week    PT Duration 6 weeks    PT Treatment/Interventions ADLs/Self Care Home Management;Cryotherapy;Electrical Stimulation;Moist Heat;Traction;Neuromuscular re-education;Balance training;Therapeutic exercise;Therapeutic activities;Functional mobility training;Stair training;Gait training;Patient/family education;Manual techniques;Dry needling;Passive range of motion;Spinal Manipulations;Joint Manipulations    PT Next Visit Plan Assess HEP and progress PRN, manual to improve lumbar mobility and reduce feeling of stiffness, progress gross core and hip strengthening    PT Home Exercise Plan [id number]: bridge, dead bug with knees bent, LTR with feet elevated, side clamshell with yellow, side plank on knees, bird dog    Consulted and Agree with  Plan of Care Patient           Patient will benefit from skilled therapeutic intervention in order to improve the following deficits and impairments:  Postural dysfunction, Decreased strength, Decreased activity tolerance, Pain  Visit Diagnosis: Chronic bilateral low back pain without sciatica  Muscle weakness (generalized)     Problem List Patient Active Problem List   Diagnosis Date Noted  . History of colonic polyps 03/13/2014  . Hyperlipidemia with target LDL less than 130 03/13/2014  . History of vitamin D deficiency 03/13/2014  . BPH (benign prostatic hyperplasia) 03/13/2014  . History of renal stone 03/13/2014  . Hypertension 10/20/2012    Hilda Blades, PT, DPT, LAT, ATC 07/16/20  4:44 PM Phone: [phone] Fax: [place] [facility] [address] [place], [place], [address] Phone: [phone]   Fax:  [phone]  Name: Mike Wong MRN: [id number] Date of Birth: 09/12/1946

## 2020-07-30 ENCOUNTER — Ambulatory Visit: Payer: Medicare Other | Attending: Family Medicine | Admitting: Physical Therapy

## 2020-07-30 ENCOUNTER — Other Ambulatory Visit: Payer: Self-pay

## 2020-07-30 ENCOUNTER — Encounter: Payer: Self-pay | Admitting: Physical Therapy

## 2020-07-30 DIAGNOSIS — M545 Low back pain: Secondary | ICD-10-CM | POA: Insufficient documentation

## 2020-07-30 DIAGNOSIS — G8929 Other chronic pain: Secondary | ICD-10-CM | POA: Insufficient documentation

## 2020-07-30 DIAGNOSIS — M6281 Muscle weakness (generalized): Secondary | ICD-10-CM | POA: Diagnosis not present

## 2020-07-30 NOTE — Therapy (Signed)
[place], [place], [address] Phone: [phone]   Fax:  [phone]  Physical Therapy Treatment  Patient Details  Name: Mike Wong MRN: [id number] Date of Birth: 04-Jun-1946 Referring Provider (PT): Low back pain   Encounter Date: 07/30/2020   PT End of Session - 07/30/20 1541    Visit Number 2    Number of Visits 6    Date for PT Re-Evaluation 08/27/20    Authorization Type MCR    Progress Note Due on Visit 10    PT Start Time 1503    PT Stop Time 1545    PT Time Calculation (min) 42 min    Activity Tolerance Patient tolerated treatment well    Behavior During Therapy [facility] for tasks assessed/performed           Past Medical History:  Diagnosis Date  . Aneurysm artery, popliteal ([place]) 02/2016   RT LEG   . Arthritis   . Clotting disorder (HCC)    leg artery aneurysm   . Finger fracture, right    5th finger  . High cholesterol   . Hypertension   . Kidney stones     Past Surgical History:  Procedure Laterality Date  . BYPASS GRAFT POPLITEAL TO POPLITEAL Right 03/03/2016   Procedure: BYPASS RIGHT ABOVE POPLITEAL TO BELOW POPLITEAL USING REVERSED RIGHT GREATER SAPHENOUS VEIN;  Surgeon: Elam Dutch, MD;  Location: [place];  Service: Vascular;  Laterality: Right;  . COLONOSCOPY  11/04/06  . ELECTROCARDIOGRAM  03/16/03  . EMBOLECTOMY Right 03/03/2016   Procedure: RIGHT ANTERIOR TIBIAL EMBOLECTOMY;  Surgeon: Elam Dutch, MD;  Location: [place];  Service: Vascular;  Laterality: Right;  . FALSE ANEURYSM REPAIR Right 03/03/2016   Procedure: LIGATION RIGHT  POPLITEAL ARTERY ANEURYSM;  Surgeon: Elam Dutch, MD;  Location: [place];  Service: Vascular;  Laterality: Right;  . LITHOTRIPSY    . LITHOTRIPSY    . OPEN REDUCTION INTERNAL FIXATION (ORIF) METACARPAL Right 10/08/2015   Procedure: OPEN REDUCTION INTERNAL FIXATION (ORIF) METACARPAL RIGHT RING FINGER;  Surgeon: Daryll Brod, MD;   Location: [place];  Service: Orthopedics;  Laterality: Right;  . POLYPECTOMY      There were no vitals filed for this visit.   Subjective Assessment - 07/30/20 1506    Subjective "I feel like I am doing better in the morning"    Patient Stated Goals Improve back discomfort and stiffness in the morning    Currently in Pain? No/denies    Aggravating Factors  getting out of bed in the AM    Pain Relieving Factors stretcing,              [facility] PT Assessment - 07/30/20 0001      Assessment   Medical Diagnosis [name], MD    Referring Provider (PT) Low back pain                         [facility] Adult PT Treatment/Exercise - 07/30/20 0001      Self-Care   Self-Care Posture    Posture posture education with associated handout      Lumbar Exercises: Stretches   Single Knee to Chest Stretch Left;Right;2 reps;30 seconds    Lower Trunk Rotation --   1 x 20    Other Lumbar Stretch Exercise low back stretch seated walking hands down legs 2 x 30      Lumbar Exercises: Aerobic  Nustep L5 x 6 min UE/LE      Lumbar Exercises: Supine   Dead Bug 10 reps   10 seconds   Dead Bug Limitations cues to maitain PPT and keep core tight throughout execrcise.    Bridge 10 reps   1 set iwht heels on red physioball     Lumbar Exercises: Sidelying   Hip Abduction Both;10 reps   x 2 sets   Other Sidelying Lumbar Exercises Side plank on knees 5 x 10 sec      Lumbar Exercises: Quadruped   Opposite Arm/Leg Raise 10 reps;3 seconds   x 2 sets   Plank tall plank 5 x 10 sec hold (modified from knees)   gripping side of table to avoid wrist soreness                 PT Education - 07/30/20 1528    Education Details Reviewed HEP, FOTO assessment, efficient posture and lifting mechanics.    Person(s) Educated Patient    Methods Explanation    Comprehension Verbalized understanding            PT Short Term Goals - 07/16/20 1455      PT SHORT TERM  GOAL #1   Title Patient will be I with initial HEP to progress with PT    Time 3    Period Weeks    Status New    Target Date 08/06/20             PT Long Term Goals - 07/16/20 1455      PT LONG TERM GOAL #1   Title Patient will be I with final HEP to maintain progress from PT    Time 6    Period Weeks    Status New    Target Date 08/27/20      PT LONG TERM GOAL #2   Title Patient will exhibit improved core and hip strength to >/= 4+/5 MMT to improve lifting and abilityto perform household tasks    Time 6    Period Weeks    Status New    Target Date 08/27/20      PT LONG TERM GOAL #3   Title Patient will demonstrated AROM without any stiffness to improve mobility in the morning and getting dressed    Time 6    Period Weeks    Status New    Target Date 08/27/20      PT LONG TERM GOAL #4   Title Patient will report improved functional level to </= 23% limitation on FOTO    Time 6    Period Weeks    Status New    Target Date 08/27/20                 Plan - 07/30/20 1520    Clinical Impression Statement pt reports consistency with his HEP and notes he feels he is getting better. reviewed HEP with pt required min cues for proper form. He performed all exericses well for core and hip strengthening. reviewed efficient posture and provided associated handout.    PT Treatment/Interventions ADLs/Self Care Home Management;Cryotherapy;Electrical Stimulation;Moist Heat;Traction;Neuromuscular re-education;Balance training;Therapeutic exercise;Therapeutic activities;Functional mobility training;Stair training;Gait training;Patient/family education;Manual techniques;Dry needling;Passive range of motion;Spinal Manipulations;Joint Manipulations    PT Next Visit Plan Assess HEP and progress PRN, manual to improve lumbar mobility and reduce feeling of stiffness, progress gross core and hip strengthening    PT Home Exercise Plan [id number]: bridge, dead bug with knees bent, LTR with  feet elevated, side clamshell with yellow, side plank on knees, bird dog    Consulted and Agree with Plan of Care Patient           Patient will benefit from skilled therapeutic intervention in order to improve the following deficits and impairments:  Postural dysfunction, Decreased strength, Decreased activity tolerance, Pain  Visit Diagnosis: Chronic bilateral low back pain without sciatica  Muscle weakness (generalized)     Problem List Patient Active Problem List   Diagnosis Date Noted  . History of colonic polyps 03/13/2014  . Hyperlipidemia with target LDL less than 130 03/13/2014  . History of vitamin D deficiency 03/13/2014  . BPH (benign prostatic hyperplasia) 03/13/2014  . History of renal stone 03/13/2014  . Hypertension 10/20/2012    [place] PT, DPT, LAT, ATC  07/30/20  3:52 PM      [facility] [address] [place], [place], [address] Phone: [phone]   Fax:  [phone]  Name: Mike Wong MRN: [id number] Date of Birth: 07/28/1946

## 2020-07-30 NOTE — Patient Instructions (Signed)

## 2020-08-06 ENCOUNTER — Encounter: Payer: Self-pay | Admitting: Physical Therapy

## 2020-08-06 ENCOUNTER — Other Ambulatory Visit: Payer: Self-pay

## 2020-08-06 ENCOUNTER — Ambulatory Visit: Payer: Medicare Other | Admitting: Physical Therapy

## 2020-08-06 DIAGNOSIS — M6281 Muscle weakness (generalized): Secondary | ICD-10-CM

## 2020-08-06 DIAGNOSIS — G8929 Other chronic pain: Secondary | ICD-10-CM

## 2020-08-06 DIAGNOSIS — M545 Low back pain: Secondary | ICD-10-CM | POA: Diagnosis not present

## 2020-08-06 NOTE — Therapy (Signed)
[place], [place], [address] Phone: [phone]   Fax:  [phone]  Physical Therapy Treatment  Patient Details  Name: Mike Wong MRN: [id number] Date of Birth: [birth year] Referring Provider (PT): Low back pain   Encounter Date: 08/06/2020   PT End of Session - 08/06/20 1536    Visit Number 3    Number of Visits 6    Date for PT Re-Evaluation 08/27/20    Authorization Type MCR    Progress Note Due on Visit 10    PT Start Time 3846    PT Stop Time 1610    PT Time Calculation (min) 40 min    Activity Tolerance Patient tolerated treatment well    Behavior During Therapy [facility] for tasks assessed/performed           Past Medical History:  Diagnosis Date  . Aneurysm artery, popliteal ([place]) 02/2016   RT LEG   . Arthritis   . Clotting disorder (HCC)    leg artery aneurysm   . Finger fracture, right    5th finger  . High cholesterol   . Hypertension   . Kidney stones     Past Surgical History:  Procedure Laterality Date  . BYPASS GRAFT POPLITEAL TO POPLITEAL Right 03/03/2016   Procedure: BYPASS RIGHT ABOVE POPLITEAL TO BELOW POPLITEAL USING REVERSED RIGHT GREATER SAPHENOUS VEIN;  Surgeon: Elam Dutch, MD;  Location: [place];  Service: Vascular;  Laterality: Right;  . COLONOSCOPY  11/04/06  . ELECTROCARDIOGRAM  03/16/03  . EMBOLECTOMY Right 03/03/2016   Procedure: RIGHT ANTERIOR TIBIAL EMBOLECTOMY;  Surgeon: Elam Dutch, MD;  Location: [place];  Service: Vascular;  Laterality: Right;  . FALSE ANEURYSM REPAIR Right 03/03/2016   Procedure: LIGATION RIGHT  POPLITEAL ARTERY ANEURYSM;  Surgeon: Elam Dutch, MD;  Location: [place];  Service: Vascular;  Laterality: Right;  . LITHOTRIPSY    . LITHOTRIPSY    . OPEN REDUCTION INTERNAL FIXATION (ORIF) METACARPAL Right 10/08/2015   Procedure: OPEN REDUCTION INTERNAL FIXATION (ORIF) METACARPAL RIGHT RING FINGER;  Surgeon: Daryll Brod, MD;   Location: [place];  Service: Orthopedics;  Laterality: Right;  . POLYPECTOMY      There were no vitals filed for this visit.   Subjective Assessment - 08/06/20 1535    Subjective Patient reports he is doing well. Exercises are going well. He notes continued stiffness in the morning.    Patient Stated Goals Improve back discomfort and stiffness in the morning    Currently in Pain? No/denies                             [facility] Adult PT Treatment/Exercise - 08/06/20 0001      Exercises   Exercises Lumbar      Lumbar Exercises: Stretches   Lower Trunk Rotation 5 reps;10 seconds    Piriformis Stretch 2 reps;30 seconds    Other Lumbar Stretch Exercise Sidelying thoracic rotation stretch x 10    Other Lumbar Stretch Exercise Child's pose stretch 3 x 20 sec      Lumbar Exercises: Aerobic   Nustep L6 x 5 min LE      Lumbar Exercises: Supine   Dead Bug 10 reps   2 sets   Dead Bug Limitations cues for proper technique    Bridge 5 reps;5 seconds    Bridge with March 10 reps      Lumbar Exercises:  Quadruped   Opposite Arm/Leg Raise 10 reps;3 seconds   2 sets                 PT Education - 08/06/20 1536    Education Details HEP update    Person(s) Educated Patient    Methods Explanation;Demonstration;Tactile cues;Verbal cues;Handout    Comprehension Verbalized understanding;Need further instruction;Returned demonstration;Verbal cues required;Tactile cues required            PT Short Term Goals - 08/06/20 1615      PT SHORT TERM GOAL #1   Title Patient will be I with initial HEP to progress with PT    Time 3    Period Weeks    Status On-going    Target Date 08/06/20             PT Long Term Goals - 07/16/20 1455      PT LONG TERM GOAL #1   Title Patient will be I with final HEP to maintain progress from PT    Time 6    Period Weeks    Status New    Target Date 08/27/20      PT LONG TERM GOAL #2   Title Patient will  exhibit improved core and hip strength to >/= 4+/5 MMT to improve lifting and abilityto perform household tasks    Time 6    Period Weeks    Status New    Target Date 08/27/20      PT LONG TERM GOAL #3   Title Patient will demonstrated AROM without any stiffness to improve mobility in the morning and getting dressed    Time 6    Period Weeks    Status New    Target Date 08/27/20      PT LONG TERM GOAL #4   Title Patient will report improved functional level to </= 23% limitation on FOTO    Time 6    Period Weeks    Status New    Target Date 08/27/20                 Plan - 08/06/20 1537    Clinical Impression Statement Patient tolerated therapy well with no adverse effects. Patient was given a new stretching program for him to perform in the morning to reduce stiffness. He continues to require cueing for proper technique and core control, but is tolerating progressions with exercises and denies any pain with therapy. Patient would benefit from continued skilled PT to progress his strength and mobility to reduce discomfort with activity and stiffness in the morning.    PT Treatment/Interventions ADLs/Self Care Home Management;Cryotherapy;Electrical Stimulation;Moist Heat;Traction;Neuromuscular re-education;Balance training;Therapeutic exercise;Therapeutic activities;Functional mobility training;Stair training;Gait training;Patient/family education;Manual techniques;Dry needling;Passive range of motion;Spinal Manipulations;Joint Manipulations    PT Next Visit Plan Assess HEP and progress PRN, manual to improve lumbar mobility and reduce feeling of stiffness, progress gross core and hip strengthening    PT Home Exercise Plan [id number]: marching bridge, dead bug with knees bent, LTR with feet elevated, side clamshell with yellow, side plank on knees, bird dog ; [id number]: piriformis stretch, LTR, sidelying thoracic/lumbar rotation, child's pose stretch    Consulted and Agree with Plan of  Care Patient           Patient will benefit from skilled therapeutic intervention in order to improve the following deficits and impairments:  Postural dysfunction, Decreased strength, Decreased activity tolerance, Pain  Visit Diagnosis: Chronic bilateral low back pain without sciatica  Muscle weakness (generalized)  Problem List Patient Active Problem List   Diagnosis Date Noted  . History of colonic polyps 03/13/2014  . Hyperlipidemia with target LDL less than 130 03/13/2014  . History of vitamin D deficiency 03/13/2014  . BPH (benign prostatic hyperplasia) 03/13/2014  . History of renal stone 03/13/2014  . Hypertension 10/20/2012    Hilda Blades, PT, DPT, LAT, ATC 08/06/20  4:16 PM Phone: [phone] Fax: [facility] [address] [place], [place], [address] Phone: [phone]   Fax:  [phone]  Name: Yarnell Arvidson MRN: [id number] Date of Birth: Dec 21, 1945

## 2020-08-06 NOTE — Patient Instructions (Addendum)
Access Code: [email or web address] URL: [email or web address] Date: 08/06/2020 Prepared by: Hilda Blades  Exercises Marching Bridge - 3 x weekly - 2 sets - 10 reps Dead Bug - 3 x weekly - 2 sets - 10 reps 90/90 Lower Trunk Rotation - 1 x daily - 2 sets - 10 reps Clamshell with Resistance - 3 x weekly - 2 sets - 15 reps Side Plank on Knees - 3 x weekly - 5 reps - 10 seconds hold Bird Dog - 3 x weekly - 2 sets - 10 reps - 3 seconds hold   Access Code: [email or web address] URL: [email or web address] Date: 08/06/2020 Prepared by: Hilda Blades  Exercises Supine Piriformis Stretch with Foot on Ground - 1 x daily - 3 reps - 20 seconds hold Supine Lower Trunk Rotation - 1 x daily - 5 reps - 10 seconds hold Sidelying Thoracic Lumbar Rotation - 1 x daily - 5 reps - 10 seconds hold Child's Pose Stretch - 1 x daily - 3 sets - 3 reps - 20 seconds hold

## 2020-08-13 ENCOUNTER — Encounter: Payer: Self-pay | Admitting: Physical Therapy

## 2020-08-13 ENCOUNTER — Ambulatory Visit: Payer: Medicare Other | Admitting: Physical Therapy

## 2020-08-13 ENCOUNTER — Other Ambulatory Visit: Payer: Self-pay

## 2020-08-13 DIAGNOSIS — M545 Low back pain, unspecified: Secondary | ICD-10-CM

## 2020-08-13 DIAGNOSIS — M6281 Muscle weakness (generalized): Secondary | ICD-10-CM

## 2020-08-13 DIAGNOSIS — G8929 Other chronic pain: Secondary | ICD-10-CM | POA: Diagnosis not present

## 2020-08-13 NOTE — Therapy (Signed)
[place], [place], [address] Phone: [phone]   Fax:  [phone]  Physical Therapy Treatment  Patient Details  Name: Mike Wong MRN: [id number] Date of Birth: 02/07/1946 Referring Provider (PT): Low back pain   Encounter Date: 08/13/2020   PT End of Session - 08/13/20 1539    Visit Number 4    Number of Visits 6    Date for PT Re-Evaluation 08/27/20    Authorization Type MCR    Progress Note Due on Visit 10    PT Start Time 0623    PT Stop Time 1613    PT Time Calculation (min) 42 min    Activity Tolerance Patient tolerated treatment well    Behavior During Therapy [facility] for tasks assessed/performed           Past Medical History:  Diagnosis Date  . 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 Arthritis   . Clotting disorder (HCC)    leg artery aneurysm   . Finger fracture, right    5th finger  . High cholesterol   . Hypertension   . Kidney stones     Past Surgical History:  Procedure Laterality Date  . BYPASS GRAFT POPLITEAL TO POPLITEAL Right 03/03/2016   Procedure: BYPASS RIGHT ABOVE POPLITEAL TO BELOW POPLITEAL USING REVERSED RIGHT GREATER SAPHENOUS VEIN;  Surgeon: Elam Dutch, MD;  Location: [place];  Service: Vascular;  Laterality: Right;  . COLONOSCOPY  11/04/06  . ELECTROCARDIOGRAM  03/16/03  . EMBOLECTOMY Right 03/03/2016   Procedure: RIGHT ANTERIOR TIBIAL EMBOLECTOMY;  Surgeon: Elam Dutch, MD;  Location: [place];  Service: Vascular;  Laterality: Right;  . FALSE ANEURYSM REPAIR Right 03/03/2016   Procedure: LIGATION RIGHT  POPLITEAL ARTERY ANEURYSM;  Surgeon: Elam Dutch, MD;  Location: [place];  Service: Vascular;  Laterality: Right;  . LITHOTRIPSY    . LITHOTRIPSY    . OPEN REDUCTION INTERNAL FIXATION (ORIF) METACARPAL Right 10/08/2015   Procedure: OPEN REDUCTION INTERNAL FIXATION (ORIF) METACARPAL RIGHT RING FINGER;  Surgeon: Daryll Brod, MD;   Location: [place];  Service: Orthopedics;  Laterality: Right;  . POLYPECTOMY      There were no vitals filed for this visit.   Subjective Assessment - 08/13/20 1538    Subjective Patient reports he is doing well, no new issues.    Patient Stated Goals Improve back discomfort and stiffness in the morning    Currently in Pain? No/denies                             [facility] Adult PT Treatment/Exercise - 08/13/20 0001      Exercises   Exercises Lumbar      Lumbar Exercises: Stretches   Lower Trunk Rotation 5 reps;10 seconds    Lower Trunk Rotation Limitations figure-4 position    Piriformis Stretch 2 reps;30 seconds    Piriformis Stretch Limitations seated    Other Lumbar Stretch Exercise Seated lumbar stretch with physioball 5 x 5 sec fwd, 5 x 5 sec lateral each      Lumbar Exercises: Aerobic   Nustep L7 x 5 min LE only      Lumbar Exercises: Standing   Lifting 10 reps   3 sets   Lifting Weights (lbs) 30    Lifting Limitations deadlift - cueing for proper hip hinge technique    Other Standing Lumbar Exercises Pallof  press with 7# on FM 2 x 10 x 3 sec hold      Lumbar Exercises: Supine   Dead Bug Limitations 4 x 6 using physioball    Bridge with March 10 reps                  PT Education - 08/13/20 1539    Education Details HEP    Person(s) Educated Patient    Methods Explanation    Comprehension Verbalized understanding;Need further instruction            PT Short Term Goals - 08/06/20 1615      PT SHORT TERM GOAL #1   Title Patient will be I with initial HEP to progress with PT    Time 3    Period Weeks    Status On-going    Target Date 08/06/20             PT Long Term Goals - 07/16/20 1455      PT LONG TERM GOAL #1   Title Patient will be I with final HEP to maintain progress from PT    Time 6    Period Weeks    Status New    Target Date 08/27/20      PT LONG TERM GOAL #2   Title Patient will exhibit  improved core and hip strength to >/= 4+/5 MMT to improve lifting and abilityto perform household tasks    Time 6    Period Weeks    Status New    Target Date 08/27/20      PT LONG TERM GOAL #3   Title Patient will demonstrated AROM without any stiffness to improve mobility in the morning and getting dressed    Time 6    Period Weeks    Status New    Target Date 08/27/20      PT LONG TERM GOAL #4   Title Patient will report improved functional level to </= 23% limitation on FOTO    Time 6    Period Weeks    Status New    Target Date 08/27/20                 Plan - 08/13/20 1605    Clinical Impression Statement Patient tolerated therapy well with no adverse effects. He continues to denies pain mostly throughout the day but does not continued occasional stiffness in the morning that improves with stretching and movement. Progressed core strengthening and initiated lifting with deadlift this visit. Patient did require cueing for proper hip hinge technique and maintaining neutral lumbar spine. He denied any increased pain this visit and no change made to HEP. Patient would benefit from continued skilled PT to progress his strength and mobility to reduce discomfort with activity and stiffness in the morning.    PT Treatment/Interventions ADLs/Self Care Home Management;Cryotherapy;Electrical Stimulation;Moist Heat;Traction;Neuromuscular re-education;Balance training;Therapeutic exercise;Therapeutic activities;Functional mobility training;Stair training;Gait training;Patient/family education;Manual techniques;Dry needling;Passive range of motion;Spinal Manipulations;Joint Manipulations    PT Next Visit Plan Assess HEP and progress PRN, progress gross core and hip strengthening, stretching and manual PRN for lumbar and hip flexbility    PT Home Exercise Plan [id number]: marching bridge, dead bug with knees bent, LTR with feet elevated, side clamshell with yellow, side plank on knees, bird dog  ; [email or web address]: piriformis stretch, LTR, sidelying thoracic/lumbar rotation, child's pose stretch    Consulted and Agree with Plan of Care Patient           Patient will  benefit from skilled therapeutic intervention in order to improve the following deficits and impairments:  Postural dysfunction, Decreased strength, Decreased activity tolerance, Pain  Visit Diagnosis: Chronic bilateral low back pain without sciatica  Muscle weakness (generalized)     Problem List Patient Active Problem List   Diagnosis Date Noted  . History of colonic polyps 03/13/2014  . Hyperlipidemia with target LDL less than 130 03/13/2014  . History of vitamin D deficiency 03/13/2014  . BPH (benign prostatic hyperplasia) 03/13/2014  . History of renal stone 03/13/2014  . Hypertension 10/20/2012    Hilda Blades, PT, DPT, LAT, ATC 08/13/20  4:16 PM Phone: [phone] Fax: [facility] [address] [place], [place], [address] Phone: [phone]   Fax:  [phone]  Name: Mike Wong MRN: [id number] Date of Birth: 05-10-1946

## 2020-08-22 ENCOUNTER — Ambulatory Visit: Payer: Medicare Other | Attending: Family Medicine | Admitting: Rehabilitative and Restorative Service Providers"

## 2020-08-22 ENCOUNTER — Encounter: Payer: Self-pay | Admitting: Rehabilitative and Restorative Service Providers"

## 2020-08-22 ENCOUNTER — Other Ambulatory Visit: Payer: Self-pay

## 2020-08-22 DIAGNOSIS — G8929 Other chronic pain: Secondary | ICD-10-CM | POA: Insufficient documentation

## 2020-08-22 DIAGNOSIS — M6281 Muscle weakness (generalized): Secondary | ICD-10-CM | POA: Insufficient documentation

## 2020-08-22 DIAGNOSIS — M545 Low back pain, unspecified: Secondary | ICD-10-CM | POA: Insufficient documentation

## 2020-08-22 NOTE — Therapy (Signed)
[place], [place], [address] Phone: [phone]   Fax:  [phone]  Physical Therapy Treatment  Patient Details  Name: Mike Wong MRN: [id number] Date of Birth: 11/20/1945 Referring Provider (PT): Low back pain   Encounter Date: 08/22/2020   PT End of Session - 08/22/20 1600    Visit Number 5    Number of Visits 6    Date for PT Re-Evaluation 08/27/20    Authorization Type MCR    Progress Note Due on Visit 10    PT Start Time 0346    PT Stop Time 0429    PT Time Calculation (min) 43 min    Activity Tolerance Patient tolerated treatment well;No increased pain    Behavior During Therapy WFL for tasks assessed/performed           Past Medical History:  Diagnosis Date  . 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 Arthritis   . Clotting disorder (HCC)    leg artery aneurysm   . Finger fracture, right    5th finger  . High cholesterol   . Hypertension   . Kidney stones     Past Surgical History:  Procedure Laterality Date  . BYPASS GRAFT POPLITEAL TO POPLITEAL Right 03/03/2016   Procedure: BYPASS RIGHT ABOVE POPLITEAL TO BELOW POPLITEAL USING REVERSED RIGHT GREATER SAPHENOUS VEIN;  Surgeon: Elam Dutch, MD;  Location: [place];  Service: Vascular;  Laterality: Right;  . COLONOSCOPY  11/04/06  . ELECTROCARDIOGRAM  03/16/03  . EMBOLECTOMY Right 03/03/2016   Procedure: RIGHT ANTERIOR TIBIAL EMBOLECTOMY;  Surgeon: Elam Dutch, MD;  Location: [place];  Service: Vascular;  Laterality: Right;  . FALSE ANEURYSM REPAIR Right 03/03/2016   Procedure: LIGATION RIGHT  POPLITEAL ARTERY ANEURYSM;  Surgeon: Elam Dutch, MD;  Location: [place];  Service: Vascular;  Laterality: Right;  . LITHOTRIPSY    . LITHOTRIPSY    . OPEN REDUCTION INTERNAL FIXATION (ORIF) METACARPAL Right 10/08/2015   Procedure: OPEN REDUCTION INTERNAL FIXATION (ORIF) METACARPAL RIGHT RING FINGER;  Surgeon: Daryll Brod, MD;  Location: [place];  Service: Orthopedics;  Laterality: Right;  . POLYPECTOMY      There were no vitals filed for this visit.   Subjective Assessment - 08/22/20 1551    Subjective Patient reports he is doing well, no new issues. Exercises are helping. Stiffness is still present but it's better.    Currently in Pain? No/denies    Pain Score 0-No pain    Multiple Pain Sites No                             [facility] Adult PT Treatment/Exercise - 08/22/20 0001      Lumbar Exercises: Supine   Other Supine Lumbar Exercises knee to chest stretch 2x30 sec unilat bil; tilt x 10 with maximal verbal and tactile cues; tilt with march x 15 with maximal verbal and tactile cues, tilt with glute set and iso bridge with clam shell x 20; tilt with heel slide x 20 each side;  prone heel squeeze x 20 with 5 sec hold; hip ext with glute set x 20; donkey kick x 20 with 1-2 sec hold, lower trunk rotation x 5 times 5 sec hold; seated hip hinge to neutral spine with core tightening and glute set as approp through ROM x 20; seated trunk flexion stretch 3x15 sec.; Nustep bil  UE/LEs level 6 x 5 min with PT verbal cues for stretch and lumbar flexibility after supine/prone/seated exercises                    PT Short Term Goals - 08/22/20 1603      PT SHORT TERM GOAL #1   Title Patient will be I with initial HEP to progress with PT    Status On-going             PT Long Term Goals - 07/16/20 1455      PT LONG TERM GOAL #1   Title Patient will be I with final HEP to maintain progress from PT    Time 6    Period Weeks    Status New    Target Date 08/27/20      PT LONG TERM GOAL #2   Title Patient will exhibit improved core and hip strength to >/= 4+/5 MMT to improve lifting and abilityto perform household tasks    Time 6    Period Weeks    Status New    Target Date 08/27/20      PT LONG TERM GOAL #3   Title Patient will demonstrated AROM without  any stiffness to improve mobility in the morning and getting dressed    Time 6    Period Weeks    Status New    Target Date 08/27/20      PT LONG TERM GOAL #4   Title Patient will report improved functional level to </= 23% limitation on FOTO    Time 6    Period Weeks    Status New    Target Date 08/27/20                 Plan - 08/22/20 1600    Clinical Impression Statement Pt presents to PT with continued c/o slight lumbar stiffness in the AM. He has difficulty maintaining a pelvic tilt without compensation with movement but did improve throughout session. Pt would benefit from continued lumbar/core strengthening to assist with LBP management.    Rehab Potential Good    PT Frequency 1x / week    PT Duration 6 weeks    PT Treatment/Interventions ADLs/Self Care Home Management;Cryotherapy;Electrical Stimulation;Moist Heat;Traction;Neuromuscular re-education;Balance training;Therapeutic exercise;Therapeutic activities;Functional mobility training;Stair training;Gait training;Patient/family education;Manual techniques;Dry needling;Passive range of motion;Spinal Manipulations;Joint Manipulations    PT Next Visit Plan continue to progress lumbar/core strengthening exercises to assist with LBP management; stretching and manual PRN for lumbar and hip flexibility    Consulted and Agree with Plan of Care Patient           Patient will benefit from skilled therapeutic intervention in order to improve the following deficits and impairments:  Postural dysfunction, Decreased strength, Decreased activity tolerance, Pain  Visit Diagnosis: Chronic bilateral low back pain without sciatica  Muscle weakness (generalized)     Problem List Patient Active Problem List   Diagnosis Date Noted  . History of colonic polyps 03/13/2014  . Hyperlipidemia with target LDL less than 130 03/13/2014  . History of vitamin D deficiency 03/13/2014  . BPH (benign prostatic hyperplasia) 03/13/2014  .  History of renal stone 03/13/2014  . Hypertension 10/20/2012    Mike Wong, PT 08/22/2020, 4:31 PM  [facility] [address] [place], [place], [address] Phone: [phone]   Fax:  [phone]  Name: Mike Wong MRN: [id number] Date of Birth: 26-May-1946

## 2020-08-27 ENCOUNTER — Ambulatory Visit: Payer: Medicare Other | Admitting: Physical Therapy

## 2020-08-27 ENCOUNTER — Encounter: Payer: Self-pay | Admitting: Physical Therapy

## 2020-08-27 ENCOUNTER — Other Ambulatory Visit: Payer: Self-pay

## 2020-08-27 DIAGNOSIS — M545 Low back pain, unspecified: Secondary | ICD-10-CM | POA: Diagnosis not present

## 2020-08-27 DIAGNOSIS — G8929 Other chronic pain: Secondary | ICD-10-CM | POA: Diagnosis not present

## 2020-08-27 DIAGNOSIS — M6281 Muscle weakness (generalized): Secondary | ICD-10-CM | POA: Diagnosis not present

## 2020-08-27 NOTE — Therapy (Signed)
[place], [place], [address] Phone: [phone]   Fax:  [phone]  Physical Therapy Treatment / ERO  Patient Details  Name: Mike Wong MRN: [id number] Date of Birth: 04/18/1946 Referring Provider (PT): Low back pain   Encounter Date: 08/27/2020   PT End of Session - 08/27/20 1535    Visit Number 6    Number of Visits 8    Date for PT Re-Evaluation 09/10/20    Authorization Type MCR    Progress Note Due on Visit 10    PT Start Time 1532    PT Stop Time 2297    PT Time Calculation (min) 42 min    Activity Tolerance Patient tolerated treatment well;No increased pain    Behavior During Therapy WFL for tasks assessed/performed           Past Medical History:  Diagnosis Date  . 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 Arthritis   . Clotting disorder (HCC)    leg artery aneurysm   . Finger fracture, right    5th finger  . High cholesterol   . Hypertension   . Kidney stones     Past Surgical History:  Procedure Laterality Date  . BYPASS GRAFT POPLITEAL TO POPLITEAL Right 03/03/2016   Procedure: BYPASS RIGHT ABOVE POPLITEAL TO BELOW POPLITEAL USING REVERSED RIGHT GREATER SAPHENOUS VEIN;  Surgeon: Elam Dutch, MD;  Location: [place];  Service: Vascular;  Laterality: Right;  . COLONOSCOPY  11/04/06  . ELECTROCARDIOGRAM  03/16/03  . EMBOLECTOMY Right 03/03/2016   Procedure: RIGHT ANTERIOR TIBIAL EMBOLECTOMY;  Surgeon: Elam Dutch, MD;  Location: [place];  Service: Vascular;  Laterality: Right;  . FALSE ANEURYSM REPAIR Right 03/03/2016   Procedure: LIGATION RIGHT  POPLITEAL ARTERY ANEURYSM;  Surgeon: Elam Dutch, MD;  Location: [place];  Service: Vascular;  Laterality: Right;  . LITHOTRIPSY    . LITHOTRIPSY    . OPEN REDUCTION INTERNAL FIXATION (ORIF) METACARPAL Right 10/08/2015   Procedure: OPEN REDUCTION INTERNAL FIXATION (ORIF) METACARPAL RIGHT RING FINGER;  Surgeon:  Daryll Brod, MD;  Location: [place];  Service: Orthopedics;  Laterality: Right;  . POLYPECTOMY      There were no vitals filed for this visit.   Subjective Assessment - 08/27/20 1534    Subjective Patient reports he is doing well with no new issues. Denies any pain.    Patient Stated Goals Improve back discomfort and stiffness in the morning    Currently in Pain? No/denies              [facility] PT Assessment - 08/27/20 0001      Assessment   Medical Diagnosis [name], MD    Referring Provider (PT) Low back pain      Precautions   Precautions None      Restrictions   Weight Bearing Restrictions No      Balance Screen   Has the patient fallen in the past 6 months No    Has the patient had a decrease in activity level because of a fear of falling?  No    Is the patient reluctant to leave their home because of a fear of falling?  No      Prior Function   Level of Independence Independent      Observation/Other Assessments   Focus on Therapeutic Outcomes (FOTO)  28% limitation      AROM   Overall  AROM Comments Patient exhibits lumbar AROM WNL and non-painful      Strength   Overall Strength Comments Core strength grossly 4+/5 MMT    Right Hip Extension 4+/5    Right Hip ABduction 4/5    Left Hip Extension 4+/5    Left Hip ABduction 4/5                         [facility] Adult PT Treatment/Exercise - 08/27/20 0001      Self-Care   Self-Care Other Self-Care Comments    Other Self-Care Comments  FOTO, POC      Exercises   Exercises Lumbar      Lumbar Exercises: Stretches   Lower Trunk Rotation 5 reps    Lower Trunk Rotation Limitations figure-4 position      Lumbar Exercises: Aerobic   Nustep L7 x 5 min LE only      Lumbar Exercises: Standing   Lifting 10 reps   3 sets   Lifting Weights (lbs) 30    Lifting Limitations deadlift - cueing for proper hip hinge technique    Other Standing Lumbar Exercises Pallof press with 10#  on FM 2 x 10      Lumbar Exercises: Supine   Dead Bug 5 reps   3 sets each side   Dead Bug Limitations green physioball    Bridge Limitations Figure-4 birdge 3 x 8      Lumbar Exercises: Sidelying   Other Sidelying Lumbar Exercises Side plank on knees with clamshell hold 4 x 15 sec each                  PT Education - 08/27/20 1535    Education Details HEP    Person(s) Educated Patient    Methods Explanation    Comprehension Verbalized understanding;Need further instruction            PT Short Term Goals - 08/27/20 1605      PT SHORT TERM GOAL #1   Title Patient will be I with initial HEP to progress with PT    Status Achieved             PT Long Term Goals - 08/27/20 1605      PT LONG TERM GOAL #1   Title Patient will be I with final HEP to maintain progress from PT    Time 2    Period Weeks    Status On-going    Target Date 09/10/20      PT LONG TERM GOAL #2   Title Patient will exhibit improved core and hip strength to >/= 4+/5 MMT to improve lifting and ability to perform household tasks    Baseline Hip abduction 4/5    Time 2    Period Weeks    Status On-going    Target Date 09/10/20      PT LONG TERM GOAL #3   Title Patient will demonstrated AROM without any stiffness to improve mobility in the morning and getting dressed    Time 6    Period Weeks    Status Achieved      PT LONG TERM GOAL #4   Title Patient will report improved functional level to </= 23% limitation on FOTO    Baseline 28% limitation    Time 2    Period Weeks    Status On-going    Target Date 09/10/20  Plan - 08/27/20 1536    Clinical Impression Statement Patient tolerated therapy well with no adverse effects. He reports continued improvement and is progressing well with his core/hip strengthening. No update to HEP this visit but will focus on finalizing HEP next visit to prepare for discharge in 2 visits. He does continue to require occasional  cueing for proper core and lumbopelvic control. Patient would benefit from continued skilled PT to progress his strength and mobility to reduce discomfort with activity and stiffness in the morning.    PT Treatment/Interventions ADLs/Self Care Home Management;Cryotherapy;Electrical Stimulation;Moist Heat;Traction;Neuromuscular re-education;Balance training;Therapeutic exercise;Therapeutic activities;Functional mobility training;Stair training;Gait training;Patient/family education;Manual techniques;Dry needling;Passive range of motion;Spinal Manipulations;Joint Manipulations    PT Next Visit Plan Review HEP and progress PRN, progress gross core and hip strengthening, stretching and manual PRN for lumbar and hip flexbility    PT Home Exercise Plan [id number]: marching bridge, dead bug with knees bent, LTR with feet elevated, side clamshell with yellow, side plank on knees, bird dog; [email or web address]: piriformis stretch, LTR, sidelying thoracic/lumbar rotation, child's pose stretch    Consulted and Agree with Plan of Care Patient           Patient will benefit from skilled therapeutic intervention in order to improve the following deficits and impairments:  Postural dysfunction, Decreased strength, Decreased activity tolerance, Pain  Visit Diagnosis: Chronic bilateral low back pain without sciatica  Muscle weakness (generalized)     Problem List Patient Active Problem List   Diagnosis Date Noted  . History of colonic polyps 03/13/2014  . Hyperlipidemia with target LDL less than 130 03/13/2014  . History of vitamin D deficiency 03/13/2014  . BPH (benign prostatic hyperplasia) 03/13/2014  . History of renal stone 03/13/2014  . Hypertension 10/20/2012    Hilda Blades, PT, DPT, LAT, ATC 08/27/20  4:23 PM Phone: [phone] Fax: [facility] [address] [place], [place], [address] Phone: [phone]   Fax:   [phone]  Name: Dandre Sisler MRN: [id number] Date of Birth: 1946-05-18

## 2020-09-03 ENCOUNTER — Ambulatory Visit: Payer: Medicare Other | Admitting: Physical Therapy

## 2020-09-03 ENCOUNTER — Encounter: Payer: Self-pay | Admitting: Physical Therapy

## 2020-09-03 ENCOUNTER — Other Ambulatory Visit: Payer: Self-pay

## 2020-09-03 DIAGNOSIS — M6281 Muscle weakness (generalized): Secondary | ICD-10-CM | POA: Diagnosis not present

## 2020-09-03 DIAGNOSIS — M545 Low back pain, unspecified: Secondary | ICD-10-CM

## 2020-09-03 DIAGNOSIS — G8929 Other chronic pain: Secondary | ICD-10-CM

## 2020-09-03 NOTE — Patient Instructions (Signed)
Access Code: [email or web address] URL: [email or web address] Date: 09/03/2020 Prepared by: Hilda Blades  Exercises Figure 4 Bridge - 3 x weekly - 2 sets - 10 reps Supine Dead Bug with Leg Extension - 3 x weekly - 2 sets - 10 reps 90/90 Lower Trunk Rotation - 3 x weekly - 2 sets - 10 reps Clamshell with Resistance - 3 x weekly - 2 sets - 15 reps Side Plank with Clam - 3 x weekly - 4 reps - 20 seconds hold Bird Dog - 3 x weekly - 2 sets - 10 reps - 3 seconds hold

## 2020-09-03 NOTE — Therapy (Signed)
[place], [place], [address] Phone: [phone]   Fax:  [phone]  Physical Therapy Treatment  Patient Details  Name: Mike Wong MRN: [id number] Date of Birth: February 23, 1946 Referring Provider (PT): Low back pain   Encounter Date: 09/03/2020   PT End of Session - 09/03/20 1536    Visit Number 7    Number of Visits 8    Date for PT Re-Evaluation 09/10/20    Authorization Type MCR    Progress Note Due on Visit 10    PT Start Time 2595    PT Stop Time 1612    PT Time Calculation (min) 41 min    Activity Tolerance Patient tolerated treatment well;No increased pain    Behavior During Therapy WFL for tasks assessed/performed           Past Medical History:  Diagnosis Date  . 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 Arthritis   . Clotting disorder ([facility])    leg artery aneurysm   . Finger fracture, right    5th finger  . High cholesterol   . Hypertension   . Kidney stones     Past Surgical History:  Procedure Laterality Date  . BYPASS GRAFT POPLITEAL TO POPLITEAL Right 03/03/2016   Procedure: BYPASS RIGHT ABOVE POPLITEAL TO BELOW POPLITEAL USING REVERSED RIGHT GREATER SAPHENOUS VEIN;  Surgeon: Elam Dutch, MD;  Location: [facility];  Service: Vascular;  Laterality: Right;  . COLONOSCOPY  11/04/06  . ELECTROCARDIOGRAM  03/16/03  . EMBOLECTOMY Right 03/03/2016   Procedure: RIGHT ANTERIOR TIBIAL EMBOLECTOMY;  Surgeon: Elam Dutch, MD;  Location: [facility];  Service: Vascular;  Laterality: Right;  . FALSE ANEURYSM REPAIR Right 03/03/2016   Procedure: LIGATION RIGHT  POPLITEAL ARTERY ANEURYSM;  Surgeon: Elam Dutch, MD;  Location: [facility];  Service: Vascular;  Laterality: Right;  . LITHOTRIPSY    . LITHOTRIPSY    . OPEN REDUCTION INTERNAL FIXATION (ORIF) METACARPAL Right 10/08/2015   Procedure: OPEN REDUCTION INTERNAL FIXATION (ORIF) METACARPAL RIGHT RING FINGER;  Surgeon: Daryll Brod, MD;  Location: [facility];  Service: Orthopedics;  Laterality: Right;  . POLYPECTOMY      There were no vitals filed for this visit.   Subjective Assessment - 09/03/20 1534    Subjective Patient reports he continues to do well. No pain reported.    Patient Stated Goals Improve back discomfort and stiffness in the morning    Currently in Pain? No/denies              [facility] PT Assessment - 09/03/20 0001      Assessment   Medical Diagnosis [name], MD    Referring Provider (PT) Low back pain      AROM   Overall AROM Comments Patient exhibits lumbar AROM WNL and non-painful      Strength   Overall Strength Comments Core strength grossly 4+/5 MMT    Right Hip Extension 4+/5    Right Hip ABduction 4+/5    Left Hip Extension 4+/5    Left Hip ABduction 4+/5                         [facility] Adult PT Treatment/Exercise - 09/03/20 0001      Exercises   Exercises Lumbar      Lumbar Exercises: Stretches   Single Knee to Chest Stretch 2 reps;20 seconds  Single Knee to Chest Stretch Limitations supine    Lower Trunk Rotation 5 reps;10 seconds    Lower Trunk Rotation Limitations figure-4 position    Piriformis Stretch 2 reps;20 seconds    Piriformis Stretch Limitations supine    Other Lumbar Stretch Exercise Child's pose stretch 2 x 20 sec      Lumbar Exercises: Aerobic   Nustep L7 x 5 min LE only      Lumbar Exercises: Standing   Lifting 10 reps   3 sets   Lifting Weights (lbs) 30    Lifting Limitations Deadlift      Lumbar Exercises: Supine   Dead Bug 10 reps   2 sets   Dead Bug Limitations red physioball    Bridge Limitations Figure-4 birdge 3 x 8      Lumbar Exercises: Sidelying   Other Sidelying Lumbar Exercises Side plank on knees with clamshell hold 4 x 20 sec each      Lumbar Exercises: Quadruped   Opposite Arm/Leg Raise 10 reps;3 seconds   2 sets                 PT Education - 09/03/20 1536    Education  Details HEP update    Person(s) Educated Patient    Methods Explanation;Handout    Comprehension Verbalized understanding;Need further instruction            PT Short Term Goals - 08/27/20 1605      PT SHORT TERM GOAL #1   Title Patient will be I with initial HEP to progress with PT    Status Achieved             PT Long Term Goals - 09/03/20 1600      PT LONG TERM GOAL #1   Title Patient will be I with final HEP to maintain progress from PT    Time 2    Period Weeks    Status On-going    Target Date 09/10/20      PT LONG TERM GOAL #2   Title Patient will exhibit improved core and hip strength to >/= 4+/5 MMT to improve lifting and ability to perform household tasks    Baseline Grossly 4+/5 MMT throughout    Time 2    Period Weeks    Status Achieved      PT LONG TERM GOAL #3   Title Patient will demonstrated AROM without any stiffness to improve mobility in the morning and getting dressed    Time 6    Period Weeks    Status Achieved      PT LONG TERM GOAL #4   Title Patient will report improved functional level to </= 23% limitation on FOTO    Baseline 28% limitation    Time 2    Period Weeks    Status On-going    Target Date 09/10/20                 Plan - 09/03/20 1537    Clinical Impression Statement Patient tolerated therapy well with no adverse effects. He continues to progress well with his exercises and with lifting. He did not require any cues for technqiue for lifting mechanics. HEP progressed with good toelrance. Patient would benefit from continued skilled PT to finalize his HEP in order to reduce discomfort with activity and stiffness in the morning.    PT Treatment/Interventions ADLs/Self Care Home Management;Cryotherapy;Electrical Stimulation;Moist Heat;Traction;Neuromuscular re-education;Balance training;Therapeutic exercise;Therapeutic activities;Functional mobility training;Stair training;Gait training;Patient/family education;Manual  techniques;Dry  needling;Passive range of motion;Spinal Manipulations;Joint Manipulations    PT Next Visit Plan Review HEP and ensure independence, progress gross core and hip strengthening, stretching and manual PRN for lumbar and hip flexbility    PT Home Exercise Plan [id number]: figure-4 bridge, dead bug, LTR with feet elevated, side clamshell with yellow, side plank on knees with clamshell, bird dog; [email or web address]: piriformis stretch, LTR, sidelying thoracic/lumbar rotation, child's pose stretch    Consulted and Agree with Plan of Care Patient           Patient will benefit from skilled therapeutic intervention in order to improve the following deficits and impairments:  Postural dysfunction, Decreased strength, Decreased activity tolerance, Pain  Visit Diagnosis: Chronic bilateral low back pain without sciatica  Muscle weakness (generalized)     Problem List Patient Active Problem List   Diagnosis Date Noted  . History of colonic polyps 03/13/2014  . Hyperlipidemia with target LDL less than 130 03/13/2014  . History of vitamin D deficiency 03/13/2014  . BPH (benign prostatic hyperplasia) 03/13/2014  . History of renal stone 03/13/2014  . Hypertension 10/20/2012    Hilda Blades, PT, DPT, LAT, ATC 09/03/20  4:12 PM Phone: [phone] Fax: [facility] [address] [place], [place], [address] Phone: [phone]   Fax:  [phone]  Name: Jaymian Bogart MRN: [id number] Date of Birth: [birth year]

## 2020-09-10 ENCOUNTER — Encounter: Payer: Self-pay | Admitting: Physical Therapy

## 2020-09-10 ENCOUNTER — Ambulatory Visit: Payer: Medicare Other | Admitting: Physical Therapy

## 2020-09-10 ENCOUNTER — Other Ambulatory Visit: Payer: Self-pay

## 2020-09-10 DIAGNOSIS — G8929 Other chronic pain: Secondary | ICD-10-CM

## 2020-09-10 DIAGNOSIS — M545 Low back pain, unspecified: Secondary | ICD-10-CM | POA: Diagnosis not present

## 2020-09-10 DIAGNOSIS — M6281 Muscle weakness (generalized): Secondary | ICD-10-CM | POA: Diagnosis not present

## 2020-09-10 NOTE — Patient Instructions (Signed)
Access Code: [email or web address] URL: [email or web address] Date: 09/10/2020 Prepared by: Hilda Blades  Exercises Supine Piriformis Stretch with Foot on Ground - 1 x daily - 3 reps - 20 seconds hold Supine Lower Trunk Rotation - 1 x daily - 5 reps - 10 seconds hold Sidelying Thoracic Lumbar Rotation - 1 x daily - 5 reps - 10 seconds hold Child's Pose Stretch - 1 x daily - 3 sets - 3 reps - 20 seconds hold   Access Code: [email or web address] URL: [email or web address] Date: 09/10/2020 Prepared by: Hilda Blades  Exercises Figure 4 Bridge - 3 x weekly - 2 sets - 10 reps - 3 seconds hold Supine Dead Bug with Leg Extension - 3 x weekly - 2 sets - 10 reps 90/90 Lower Trunk Rotation - 3 x weekly - 2 sets - 10 reps Clamshell with Resistance - 3 x weekly - 2 sets - 15 reps Side Plank with Clam - 3 x weekly - 4 reps - 20 seconds hold Bird Dog - 3 x weekly - 2 sets - 10 reps - 3 seconds hold

## 2020-09-10 NOTE — Therapy (Signed)
[place], [place], [address] Phone: [phone]   Fax:  [phone]  Physical Therapy Treatment / Discharge  Patient Details  Name: Mike Wong MRN: [id number] Date of Birth: 04-27-1946 Referring Provider (PT): Low back pain   Encounter Date: 09/10/2020   PT End of Session - 09/10/20 1536    Visit Number 8    Number of Visits 8    Date for PT Re-Evaluation 09/10/20    Authorization Type MCR    Progress Note Due on Visit 10    PT Start Time 1532    PT Stop Time 1610    PT Time Calculation (min) 38 min    Activity Tolerance Patient tolerated treatment well;No increased pain    Behavior During Therapy WFL for tasks assessed/performed           Past Medical History:  Diagnosis Date  . Aneurysm artery, popliteal ([place]) 02/2016   RT LEG   . Arthritis   . Clotting disorder ([facility])    leg artery aneurysm   . Finger fracture, right    5th finger  . High cholesterol   . Hypertension   . Kidney stones     Past Surgical History:  Procedure Laterality Date  . BYPASS GRAFT POPLITEAL TO POPLITEAL Right 03/03/2016   Procedure: BYPASS RIGHT ABOVE POPLITEAL TO BELOW POPLITEAL USING REVERSED RIGHT GREATER SAPHENOUS VEIN;  Surgeon: Elam Dutch, MD;  Location: [facility];  Service: Vascular;  Laterality: Right;  . COLONOSCOPY  11/04/06  . ELECTROCARDIOGRAM  03/16/03  . EMBOLECTOMY Right 03/03/2016   Procedure: RIGHT ANTERIOR TIBIAL EMBOLECTOMY;  Surgeon: Elam Dutch, MD;  Location: [facility] [place];  Service: Vascular;  Laterality: Right;  . FALSE ANEURYSM REPAIR Right 03/03/2016   Procedure: LIGATION RIGHT  POPLITEAL ARTERY ANEURYSM;  Surgeon: Elam Dutch, MD;  Location: [place];  Service: Vascular;  Laterality: Right;  . LITHOTRIPSY    . LITHOTRIPSY    . OPEN REDUCTION INTERNAL FIXATION (ORIF) METACARPAL Right 10/08/2015   Procedure: OPEN REDUCTION INTERNAL FIXATION (ORIF) METACARPAL RIGHT RING FINGER;   Surgeon: Daryll Brod, MD;  Location: [facility];  Service: Orthopedics;  Laterality: Right;  . POLYPECTOMY      There were no vitals filed for this visit.   Subjective Assessment - 09/10/20 1535    Subjective Patient reports he is doing well, no new issues. States the stiffness he has in the morning continues to improve with the exercises. He denies pain this visit.    How long can you sit comfortably? No limitation    How long can you stand comfortably? No limitation    How long can you walk comfortably? No limitation    Patient Stated Goals Improve back discomfort and stiffness in the morning    Currently in Pain? No/denies              [facility] PT Assessment - 09/10/20 0001      Assessment   Medical Diagnosis [name], MD    Referring Provider (PT) Low back pain      Precautions   Precautions None      Restrictions   Weight Bearing Restrictions No      Balance Screen   Has the patient fallen in the past 6 months No      Prior Function   Level of Independence Independent      Observation/Other Assessments   Focus on Therapeutic Outcomes (FOTO)  17% limitation  AROM   Overall AROM Comments Patient exhibits lumbar AROM WNL and non-painful      Strength   Overall Strength Comments Core strength grossly 4+/5 MMT    Right Hip Extension 4+/5    Right Hip ABduction 4+/5    Left Hip Extension 4+/5    Left Hip ABduction 4+/5                         [facility] Adult PT Treatment/Exercise - 09/10/20 0001      Self-Care   Self-Care Other Self-Care Comments    Other Self-Care Comments  FOTO, discharge planning      Exercises   Exercises Lumbar      Lumbar Exercises: Stretches   Lower Trunk Rotation 3 reps;10 seconds    Piriformis Stretch 2 reps;20 seconds    Piriformis Stretch Limitations supine figure-4    Other Lumbar Stretch Exercise Child's pose stretch 2 x 20 sec      Lumbar Exercises: Aerobic   Recumbent Bike L4 x 5 min       Lumbar Exercises: Supine   Dead Bug 10 reps    Bridge Limitations Figure-4 birdge 3 x 8    Other Supine Lumbar Exercises LTR with feet elevated x 5 each direction      Lumbar Exercises: Sidelying   Other Sidelying Lumbar Exercises Side plank on knees with clamshell hold 4 x 20 sec each      Lumbar Exercises: Quadruped   Opposite Arm/Leg Raise 10 reps;3 seconds                  PT Education - 09/10/20 1536    Education Details HEP    Person(s) Educated Patient    Methods Explanation    Comprehension Verbalized understanding;Returned demonstration            PT Short Term Goals - 08/27/20 1605      PT SHORT TERM GOAL #1   Title Patient will be I with initial HEP to progress with PT    Status Achieved             PT Long Term Goals - 09/10/20 1546      PT LONG TERM GOAL #1   Title Patient will be I with final HEP to maintain progress from PT    Time 2    Period Weeks    Status Achieved      PT LONG TERM GOAL #2   Title Patient will exhibit improved core and hip strength to >/= 4+/5 MMT to improve lifting and ability to perform household tasks    Baseline Grossly 4+/5 MMT throughout    Time 2    Period Weeks    Status Achieved      PT LONG TERM GOAL #3   Title Patient will demonstrated AROM without any stiffness to improve mobility in the morning and getting dressed    Time 6    Period Weeks    Status Achieved      PT LONG TERM GOAL #4   Title Patient will report improved functional level to </= 23% limitation on FOTO    Time 2    Period Weeks    Status Achieved                 Plan - 09/10/20 1537    Clinical Impression Statement Patient tolerated therapy well with no adverse effects. He demonstrates independence with his HEP and he has  achieved all established goals. Patient reports morning stiffness continues to improve and he feels confident being discharged from PT.    PT Next Visit Plan NA - discharge    PT Home Exercise Plan  [id number]: figure-4 bridge, dead bug, LTR with feet elevated, side clamshell with yellow, side plank on knees with clamshell, bird dog; [email or web address]: piriformis stretch, LTR, sidelying thoracic/lumbar rotation, child's pose stretch    Consulted and Agree with Plan of Care Patient           Patient will benefit from skilled therapeutic intervention in order to improve the following deficits and impairments: NA    Visit Diagnosis: Chronic bilateral low back pain without sciatica  Muscle weakness (generalized)     Problem List Patient Active Problem List   Diagnosis Date Noted  . History of colonic polyps 03/13/2014  . Hyperlipidemia with target LDL less than 130 03/13/2014  . History of vitamin D deficiency 03/13/2014  . BPH (benign prostatic hyperplasia) 03/13/2014  . History of renal stone 03/13/2014  . Hypertension 10/20/2012    PHYSICAL THERAPY DISCHARGE SUMMARY  Visits from Start of Care: 8  Current functional level related to goals / functional outcomes: See above   Remaining deficits: See above   Education / Equipment: HEP  Plan: Patient agrees to discharge.  Patient goals were met. Patient is being discharged due to meeting the stated rehab goals.  ?????     Hilda Blades, PT, DPT, LAT, ATC 09/10/20  4:12 PM Phone: [phone] Fax: [facility] [address] [place], [place], [address] Phone: [phone]   Fax:  [phone]  Name: Mike Wong MRN: [id number] Date of Birth: 28-Jul-1946

## 2020-10-08 ENCOUNTER — Other Ambulatory Visit: Payer: Self-pay | Admitting: Family Medicine

## 2020-10-08 DIAGNOSIS — E785 Hyperlipidemia, unspecified: Secondary | ICD-10-CM

## 2020-10-12 ENCOUNTER — Other Ambulatory Visit: Payer: Self-pay | Admitting: Family Medicine

## 2020-10-12 DIAGNOSIS — E785 Hyperlipidemia, unspecified: Secondary | ICD-10-CM

## 2020-10-15 DIAGNOSIS — Z23 Encounter for immunization: Secondary | ICD-10-CM | POA: Diagnosis not present

## 2020-11-05 DIAGNOSIS — D3132 Benign neoplasm of left choroid: Secondary | ICD-10-CM | POA: Diagnosis not present

## 2020-11-05 DIAGNOSIS — H25013 Cortical age-related cataract, bilateral: Secondary | ICD-10-CM | POA: Diagnosis not present

## 2020-11-05 DIAGNOSIS — H2513 Age-related nuclear cataract, bilateral: Secondary | ICD-10-CM | POA: Diagnosis not present

## 2021-01-10 ENCOUNTER — Other Ambulatory Visit: Payer: Self-pay | Admitting: Family Medicine

## 2021-01-10 DIAGNOSIS — E785 Hyperlipidemia, unspecified: Secondary | ICD-10-CM

## 2021-04-09 ENCOUNTER — Other Ambulatory Visit: Payer: Self-pay | Admitting: Family Medicine

## 2021-04-09 DIAGNOSIS — E785 Hyperlipidemia, unspecified: Secondary | ICD-10-CM

## 2021-04-21 ENCOUNTER — Other Ambulatory Visit: Payer: Self-pay | Admitting: Family Medicine

## 2021-04-21 DIAGNOSIS — I10 Essential (primary) hypertension: Secondary | ICD-10-CM

## 2021-05-07 ENCOUNTER — Other Ambulatory Visit: Payer: Self-pay | Admitting: Family Medicine

## 2021-05-07 DIAGNOSIS — E785 Hyperlipidemia, unspecified: Secondary | ICD-10-CM

## 2021-05-30 DIAGNOSIS — Z23 Encounter for immunization: Secondary | ICD-10-CM | POA: Diagnosis not present

## 2021-07-21 ENCOUNTER — Other Ambulatory Visit: Payer: Self-pay | Admitting: Family Medicine

## 2021-07-21 DIAGNOSIS — I10 Essential (primary) hypertension: Secondary | ICD-10-CM

## 2021-07-25 ENCOUNTER — Encounter: Payer: Self-pay | Admitting: Family Medicine

## 2021-07-25 ENCOUNTER — Other Ambulatory Visit: Payer: Self-pay

## 2021-07-25 ENCOUNTER — Ambulatory Visit (INDEPENDENT_AMBULATORY_CARE_PROVIDER_SITE_OTHER): Payer: Medicare Other | Admitting: Family Medicine

## 2021-07-25 VITALS — BP 112/70 | HR 71 | Temp 97.6°F | Ht 69.0 in | Wt 166.8 lb

## 2021-07-25 DIAGNOSIS — E559 Vitamin D deficiency, unspecified: Secondary | ICD-10-CM | POA: Diagnosis not present

## 2021-07-25 DIAGNOSIS — N401 Enlarged prostate with lower urinary tract symptoms: Secondary | ICD-10-CM

## 2021-07-25 DIAGNOSIS — Z87442 Personal history of urinary calculi: Secondary | ICD-10-CM | POA: Diagnosis not present

## 2021-07-25 DIAGNOSIS — I10 Essential (primary) hypertension: Secondary | ICD-10-CM

## 2021-07-25 DIAGNOSIS — Z8639 Personal history of other endocrine, nutritional and metabolic disease: Secondary | ICD-10-CM

## 2021-07-25 DIAGNOSIS — E785 Hyperlipidemia, unspecified: Secondary | ICD-10-CM

## 2021-07-25 MED ORDER — AMLODIPINE BESYLATE 10 MG PO TABS: 10.0000 mg | ORAL_TABLET | Freq: Every day | ORAL | 3 refills | Status: DC

## 2021-07-25 MED ORDER — ATORVASTATIN CALCIUM 10 MG PO TABS: 10.0000 mg | ORAL_TABLET | Freq: Every day | ORAL | 3 refills | Status: DC

## 2021-07-25 NOTE — Progress Notes (Signed)
Mike Wong is a 75 y.o. male who presents for annual wellness visit and follow-up on chronic medical conditions.  He has no particular concerns or complaints.  He is not interested in getting a flu shot.  He does need Shingrix and Tdap.  Continues on amlodipine and having no difficulty with that.  He is also taking Lipitor for his hyperlipidemia.  He is on multivitamins.  Does have a previous history of vitamin D deficiency.   Immunizations and Health Maintenance Immunization History  Administered Date(s) Administered   PFIZER(Purple Top)SARS-COV-2 Vaccination 01/15/2020, 02/13/2020   Pneumococcal Conjugate-13 03/19/2015   Pneumococcal Polysaccharide-23 10/22/2010   Tdap 05/22/2008   Zoster, Live 10/22/2010   Health Maintenance Due  Topic Date Due   Zoster Vaccines- Shingrix (1 of 2) Never done   TETANUS/TDAP  05/22/2018   COVID-19 Vaccine (3 - Booster for Pfizer series) 07/15/2020   INFLUENZA VACCINE  Never done    Last colonoscopy: 10/31/19 Last PSA: 04/25/18 Dentist: Q year Ophtho: Q year Exercise: walking and stretching QD   Other doctors caring for patient include: Dr. Redmond Baseman ENT, Dr. Havery Moros GI Advanced Directives: Does Patient Have a Medical Advance Directive?: Yes Type of Advance Directive: Living will Does patient want to make changes to medical advance directive?: No - Patient declined  Depression screen:  See questionnaire below.     Depression screen [facility] 2/9 07/25/2021 05/23/2020 04/27/2019 04/25/2018 03/22/2017  Decreased Interest 0 0 0 0 0  Down, Depressed, Hopeless 0 0 0 0 0  PHQ - 2 Score 0 0 0 0 0    Fall Screen: See Questionaire below.   Fall Risk  07/25/2021 06/25/2020 05/23/2020 04/27/2019 04/25/2018  Falls in the past year? 0 0 0 0 No  Number falls in past yr: 0 - - - -  Injury with Fall? 0 - - - -  Risk for fall due to : No Fall Risks No Fall Risks - - -  Follow up Falls evaluation completed - - - -    ADL screen:  See questionnaire below.   Functional Status Survey: Is the patient deaf or have difficulty hearing?: No Does the patient have difficulty seeing, even when wearing glasses/contacts?: No Does the patient have difficulty concentrating, remembering, or making decisions?: No Does the patient have difficulty walking or climbing stairs?: No Does the patient have difficulty dressing or bathing?: No Does the patient have difficulty doing errands alone such as visiting a doctor's office or shopping?: No   Review of Systems  Constitutional: -, -unexpected weight change, -anorexia, -fatigue Allergy: -sneezing, -itching, -congestion Dermatology: denies changing moles, rash, lumps ENT: -runny nose, -ear pain, -sore throat,  Cardiology:  -chest pain, -palpitations, -orthopnea, Respiratory: -cough, -shortness of breath, -dyspnea on exertion, -wheezing,  Gastroenterology: -abdominal pain, -nausea, -vomiting, -diarrhea, -constipation, -dysphagia Hematology: -bleeding or bruising problems Musculoskeletal: -arthralgias, -myalgias, -joint swelling, -back pain, - Ophthalmology: -vision changes,  Urology: -dysuria, -difficulty urinating,  -urinary frequency, -urgency, incontinence Neurology: -, -numbness, , -memory loss, -falls, -dizziness    PHYSICAL EXAM:   General Appearance: Alert, cooperative, no distress, appears stated age Head: Normocephalic, without obvious abnormality, atraumatic Eyes: PERRL, conjunctiva/corneas clear, EOM's intact,  Ears: Normal TM's and external ear canals Nose: Nares normal, mucosa normal, no drainage or sinus   tenderness Throat: Lips, mucosa, and tongue normal; teeth and gums normal Neck: Supple, no lymphadenopathy, thyroid:no enlargement/tenderness/nodules; no carotid bruit or JVD Lungs: Clear to auscultation bilaterally without wheezes, rales or ronchi; respirations unlabored Heart: Regular rate and rhythm,  S1 and S2 normal, no murmur, rub or gallop Abdomen: Soft, non-tender, nondistended,  normoactive bowel sounds, no masses, no hepatosplenomegaly Extremities: No clubbing, cyanosis or edema Pulses: 2+ and symmetric all extremities Skin: Skin color, texture, turgor normal, no rashes or lesions Lymph nodes: Cervical, supraclavicular, and axillary nodes normal Neurologic: CNII-XII intact, normal strength, sensation and gait; reflexes 2+ and symmetric throughout   Psych: Normal mood, affect, hygiene and grooming  ASSESSMENT/PLAN: Essential hypertension - Plan: CBC with Differential/Platelet, Comprehensive metabolic panel, amLODipine (NORVASC) 10 MG tablet  Benign prostatic hyperplasia with lower urinary tract symptoms, symptom details unspecified  History of vitamin D deficiency - Plan: VITAMIN D 25 Hydroxy (Vit-D Deficiency, Fractures)  Hyperlipidemia with target LDL less than 130 - Plan: Lipid panel, atorvastatin (LIPITOR) 10 MG tablet  History of renal stone  Vitamin D deficiency, unspecified  - Plan: VITAMIN D 25 Hydroxy (Vit-D Deficiency, Fractures) He will call if he has difficulty with renal stones in the future.  Reinforced the need for him to continue on a good multivitamin.   Immunization recommendations discussed.  Recommend he get Tdap and Shingrix at the drugstore.  Colonoscopy recommendations reviewed.   Medicare Attestation I have personally reviewed: The patient's medical and social history Their use of alcohol, tobacco or illicit drugs Their current medications and supplements The patient's functional ability including ADLs,fall risks, home safety risks, cognitive, and hearing and visual impairment Diet and physical activities Evidence for depression or mood disorders  The patient's weight, height, and BMI have been recorded in the chart.  I have made referrals, counseling, and provided education to the patient based on review of the above and I have provided the patient with a written personalized care plan for preventive services.     Mike Alexanders,  MD   07/25/2021

## 2021-07-26 LAB — COMPREHENSIVE METABOLIC PANEL
ALT: 20 IU/L (ref 0–44)
AST: 21 IU/L (ref 0–40)
Albumin/Globulin Ratio: 2 (ref 1.2–2.2)
Albumin: 4.5 g/dL (ref 3.7–4.7)
Alkaline Phosphatase: 92 IU/L (ref 44–121)
BUN/Creatinine Ratio: 15 (ref 10–24)
BUN: 16 mg/dL (ref 8–27)
Bilirubin Total: 0.5 mg/dL (ref 0.0–1.2)
CO2: 24 mmol/L (ref 20–29)
Calcium: 9.5 mg/dL (ref 8.6–10.2)
Chloride: 105 mmol/L (ref 96–106)
Creatinine, Ser: 1.06 mg/dL (ref 0.76–1.27)
Globulin, Total: 2.3 g/dL (ref 1.5–4.5)
Glucose: 101 mg/dL — ABNORMAL HIGH (ref 65–99)
Potassium: 4 mmol/L (ref 3.5–5.2)
Sodium: 142 mmol/L (ref 134–144)
Total Protein: 6.8 g/dL (ref 6.0–8.5)
eGFR: 74 mL/min/{1.73_m2} (ref >59)

## 2021-07-26 LAB — CBC WITH DIFFERENTIAL/PLATELET
Basophils Absolute: 0 10*3/uL (ref 0.0–0.2)
Basos: 1 %
EOS (ABSOLUTE): 0.1 10*3/uL (ref 0.0–0.4)
Eos: 1 %
Hematocrit: 40.3 % (ref 37.5–51.0)
Hemoglobin: 13.6 g/dL (ref 13.0–17.7)
Immature Grans (Abs): 0 10*3/uL (ref 0.0–0.1)
Immature Granulocytes: 0 %
Lymphocytes Absolute: 1 10*3/uL (ref 0.7–3.1)
Lymphs: 16 %
MCH: 31.4 pg (ref 26.6–33.0)
MCHC: 33.7 g/dL (ref 31.5–35.7)
MCV: 93 fL (ref 79–97)
Monocytes Absolute: 0.4 10*3/uL (ref 0.1–0.9)
Monocytes: 6 %
Neutrophils Absolute: 4.8 10*3/uL (ref 1.4–7.0)
Neutrophils: 76 %
Platelets: 276 10*3/uL (ref 150–450)
RBC: 4.33 x10E6/uL (ref 4.14–5.80)
RDW: 12.6 % (ref 11.6–15.4)
WBC: 6.3 10*3/uL (ref 3.4–10.8)

## 2021-07-26 LAB — LIPID PANEL
Chol/HDL Ratio: 2.9 ratio (ref 0.0–5.0)
Cholesterol, Total: 175 mg/dL (ref 100–199)
HDL: 61 mg/dL (ref >39)
LDL Chol Calc (NIH): 92 mg/dL (ref 0–99)
Triglycerides: 127 mg/dL (ref 0–149)
VLDL Cholesterol Cal: 22 mg/dL (ref 5–40)

## 2021-07-26 LAB — VITAMIN D 25 HYDROXY (VIT D DEFICIENCY, FRACTURES): Vit D, 25-Hydroxy: 49.3 ng/mL (ref 30.0–100.0)

## 2021-07-30 NOTE — Progress Notes (Signed)
Mailed letter after no answer . [place]

## 2021-10-18 ENCOUNTER — Other Ambulatory Visit: Payer: Self-pay | Admitting: Family Medicine

## 2021-10-18 DIAGNOSIS — I10 Essential (primary) hypertension: Secondary | ICD-10-CM

## 2022-03-06 DIAGNOSIS — H43812 Vitreous degeneration, left eye: Secondary | ICD-10-CM | POA: Diagnosis not present

## 2022-03-06 DIAGNOSIS — H5319 Other subjective visual disturbances: Secondary | ICD-10-CM | POA: Diagnosis not present

## 2022-04-17 ENCOUNTER — Other Ambulatory Visit: Payer: Self-pay | Admitting: Family Medicine

## 2022-04-17 DIAGNOSIS — I10 Essential (primary) hypertension: Secondary | ICD-10-CM

## 2022-04-21 DIAGNOSIS — H43812 Vitreous degeneration, left eye: Secondary | ICD-10-CM | POA: Diagnosis not present

## 2022-04-21 DIAGNOSIS — H2513 Age-related nuclear cataract, bilateral: Secondary | ICD-10-CM | POA: Diagnosis not present

## 2022-04-21 DIAGNOSIS — H25013 Cortical age-related cataract, bilateral: Secondary | ICD-10-CM | POA: Diagnosis not present

## 2022-07-17 ENCOUNTER — Other Ambulatory Visit: Payer: Self-pay | Admitting: Family Medicine

## 2022-07-17 DIAGNOSIS — E785 Hyperlipidemia, unspecified: Secondary | ICD-10-CM

## 2022-07-22 ENCOUNTER — Encounter: Payer: Self-pay | Admitting: Internal Medicine

## 2022-07-27 ENCOUNTER — Telehealth: Payer: Self-pay | Admitting: Family Medicine

## 2022-07-27 NOTE — Telephone Encounter (Signed)
Left message for patient to call back and schedule Medicare Annual Wellness Visit (AWV) either virtually or in office. I left my number for patient to call [phone].  Last AWV 07/25/21 ; please schedule at anytime with health coach   I wanted to see if patient could do AWV prior to pcp appt

## 2022-07-31 ENCOUNTER — Ambulatory Visit (INDEPENDENT_AMBULATORY_CARE_PROVIDER_SITE_OTHER): Payer: Medicare Other

## 2022-07-31 VITALS — Ht 70.0 in | Wt 175.0 lb

## 2022-07-31 DIAGNOSIS — Z0000 Encounter for general adult medical examination without abnormal findings: Secondary | ICD-10-CM

## 2022-07-31 NOTE — Patient Instructions (Signed)
[name] , Thank you for taking time to come for your Medicare Wellness Visit. I appreciate your ongoing commitment to your health goals. Please review the following plan we discussed and let me know if I can assist you in the future.   Screening recommendations/referrals: Colonoscopy: completed 10/31/2019, due 10/30/2024 Recommended yearly ophthalmology/optometry visit for glaucoma screening and checkup Recommended yearly dental visit for hygiene and checkup  Vaccinations: Influenza vaccine: decline Pneumococcal vaccine: completed 03/19/2015 Tdap vaccine: due Shingles vaccine: discussed   Covid-19:  02/13/2020, 01/15/2020  Advanced directives: copy in chart  Conditions/risks identified: none  Next appointment: Follow up in one year for your annual wellness visit.   Preventive Care 5 Years and Older, Male Preventive care refers to lifestyle choices and visits with your health care provider that can promote health and wellness. What does preventive care include? A yearly physical exam. This is also called an annual well check. Dental exams once or twice a year. Routine eye exams. Ask your health care provider how often you should have your eyes checked. Personal lifestyle choices, including: Daily care of your teeth and gums. Regular physical activity. Eating a healthy diet. Avoiding tobacco and drug use. Limiting alcohol use. Practicing safe sex. Taking low doses of aspirin every day. Taking vitamin and mineral supplements as recommended by your health care provider. What happens during an annual well check? The services and screenings done by your health care provider during your annual well check will depend on your age, overall health, lifestyle risk factors, and family history of disease. Counseling  Your health care provider may ask you questions about your: Alcohol use. Tobacco use. Drug use. Emotional well-being. Home and relationship well-being. Sexual  activity. Eating habits. History of falls. Memory and ability to understand (cognition). Work and work Statistician. Screening  You may have the following tests or measurements: Height, weight, and BMI. Blood pressure. Lipid and cholesterol levels. These may be checked every 5 years, or more frequently if you are over 56 years old. Skin check. Lung cancer screening. You may have this screening every year starting at age 50 if you have a 30-pack-year history of smoking and currently smoke or have quit within the past 15 years. Fecal occult blood test (FOBT) of the stool. You may have this test every year starting at age 78. Flexible sigmoidoscopy or colonoscopy. You may have a sigmoidoscopy every 5 years or a colonoscopy every 10 years starting at age 51. Prostate cancer screening. Recommendations will vary depending on your family history and other risks. Hepatitis C blood test. Hepatitis B blood test. Sexually transmitted disease (STD) testing. Diabetes screening. This is done by checking your blood sugar (glucose) after you have not eaten for a while (fasting). You may have this done every 1-3 years. Abdominal aortic aneurysm (AAA) screening. You may need this if you are a current or former smoker. Osteoporosis. You may be screened starting at age 15 if you are at high risk. Talk with your health care provider about your test results, treatment options, and if necessary, the need for more tests. Vaccines  Your health care provider may recommend certain vaccines, such as: Influenza vaccine. This is recommended every year. Tetanus, diphtheria, and acellular pertussis (Tdap, Td) vaccine. You may need a Td booster every 10 years. Zoster vaccine. You may need this after age 30. Pneumococcal 13-valent conjugate (PCV13) vaccine. One dose is recommended after age 50. Pneumococcal polysaccharide (PPSV23) vaccine. One dose is recommended after age 53. Talk to your health  care provider about which  screenings and vaccines you need and how often you need them. This information is not intended to replace advice given to you by your health care provider. Make sure you discuss any questions you have with your health care provider. Document Released: 11/29/2015 Document Revised: 07/22/2016 Document Reviewed: 09/03/2015 Elsevier Interactive Patient Education  2017 [employer] Prevention in the Home Falls can cause injuries. They can happen to people of all ages. There are many things you can do to make your home safe and to help prevent falls. What can I do on the outside of my home? Regularly fix the edges of walkways and driveways and fix any cracks. Remove anything that might make you trip as you walk through a door, such as a raised step or threshold. Trim any bushes or trees on the path to your home. Use bright outdoor lighting. Clear any walking paths of anything that might make someone trip, such as rocks or tools. Regularly check to see if handrails are loose or broken. Make sure that both sides of any steps have handrails. Any raised decks and porches should have guardrails on the edges. Have any leaves, snow, or ice cleared regularly. Use sand or salt on walking paths during winter. Clean up any spills in your garage right away. This includes oil or grease spills. What can I do in the bathroom? Use night lights. Install grab bars by the toilet and in the tub and shower. Do not use towel bars as grab bars. Use non-skid mats or decals in the tub or shower. If you need to sit down in the shower, use a plastic, non-slip stool. Keep the floor dry. Clean up any water that spills on the floor as soon as it happens. Remove soap buildup in the tub or shower regularly. Attach bath mats securely with double-sided non-slip rug tape. Do not have throw rugs and other things on the floor that can make you trip. What can I do in the bedroom? Use night lights. Make sure that you have a  light by your bed that is easy to reach. Do not use any sheets or blankets that are too big for your bed. They should not hang down onto the floor. Have a firm chair that has side arms. You can use this for support while you get dressed. Do not have throw rugs and other things on the floor that can make you trip. What can I do in the kitchen? Clean up any spills right away. Avoid walking on wet floors. Keep items that you use a lot in easy-to-reach places. If you need to reach something above you, use a strong step stool that has a grab bar. Keep electrical cords out of the way. Do not use floor polish or wax that makes floors slippery. If you must use wax, use non-skid floor wax. Do not have throw rugs and other things on the floor that can make you trip. What can I do with my stairs? Do not leave any items on the stairs. Make sure that there are handrails on both sides of the stairs and use them. Fix handrails that are broken or loose. Make sure that handrails are as long as the stairways. Check any carpeting to make sure that it is firmly attached to the stairs. Fix any carpet that is loose or worn. Avoid having throw rugs at the top or bottom of the stairs. If you do have throw rugs, attach them to  the floor with carpet tape. Make sure that you have a light switch at the top of the stairs and the bottom of the stairs. If you do not have them, ask someone to add them for you. What else can I do to help prevent falls? Wear shoes that: Do not have high heels. Have rubber bottoms. Are comfortable and fit you well. Are closed at the toe. Do not wear sandals. If you use a stepladder: Make sure that it is fully opened. Do not climb a closed stepladder. Make sure that both sides of the stepladder are locked into place. Ask someone to hold it for you, if possible. Clearly mark and make sure that you can see: Any grab bars or handrails. First and last steps. Where the edge of each step  is. Use tools that help you move around (mobility aids) if they are needed. These include: Canes. Walkers. Scooters. Crutches. Turn on the lights when you go into a dark area. Replace any light bulbs as soon as they burn out. Set up your furniture so you have a clear path. Avoid moving your furniture around. If any of your floors are uneven, fix them. If there are any pets around you, be aware of where they are. Review your medicines with your doctor. Some medicines can make you feel dizzy. This can increase your chance of falling. Ask your doctor what other things that you can do to help prevent falls. This information is not intended to replace advice given to you by your health care provider. Make sure you discuss any questions you have with your health care provider. Document Released: 08/29/2009 Document Revised: 04/09/2016 Document Reviewed: 12/07/2014 Elsevier Interactive Patient Education  2017 [employer].

## 2022-07-31 NOTE — Progress Notes (Signed)
I connected with Mike Wong today by telephone and verified that I am speaking with the correct person using two identifiers. Location patient: home Location provider: work Persons participating in the virtual visit: Mike Wong, Glenna Durand LPN.   I discussed the limitations, risks, security and privacy concerns of performing an evaluation and management service by telephone and the availability of in person appointments. I also discussed with the patient that there may be a patient responsible charge related to this service. The patient expressed understanding and verbally consented to this telephonic visit.    Interactive audio and video telecommunications were attempted between this provider and patient, however failed, due to patient having technical difficulties OR patient did not have access to video capability.  We continued and completed visit with audio only.     Vital signs may be patient reported or missing.  Subjective:   Mike Wong is a 76 y.o. male who presents for Medicare Annual/Subsequent preventive examination.  Review of Systems     Cardiac Risk Factors include: advanced age (>48mn, >>81women);dyslipidemia;hypertension;male gender     Objective:    Today's Vitals   07/31/22 0811  Weight: 175 lb (79.4 kg)  Height: '5\' 10"'$  (1.778 m)   Body mass index is 25.11 kg/m.     07/31/2022    8:14 AM 07/25/2021   10:36 AM 07/16/2020    2:54 PM 06/25/2020   10:17 AM 04/27/2019    8:38 AM 06/27/2018   12:50 PM 04/25/2018    9:24 AM  Advanced Directives  Does Patient Have a Medical Advance Directive? Yes Yes Yes Yes Yes No Yes  Type of Advance Directive Living will Living will Healthcare Power of [place] will  [place] will  Does patient want to make changes to medical advance directive?  No - Patient declined  No - Patient declined No - Patient declined  Yes  (MAU/Ambulatory/Procedural Areas - Information given)  Copy of [place]?    Yes - validated most recent copy scanned in chart (See row information) Yes - validated most recent copy scanned in chart (See row information)  Yes  Would patient like information on creating a medical advance directive?      No - Patient declined     Current Medications (verified) Outpatient Encounter Medications as of 07/31/2022  Medication Sig   amLODipine (NORVASC) 10 MG tablet TAKE 1 TABLET(10 MG) BY MOUTH DAILY   aspirin 81 MG chewable tablet Chew 81 mg by mouth daily.   atorvastatin (LIPITOR) 10 MG tablet TAKE 1 TABLET(10 MG) BY MOUTH DAILY   Cholecalciferol (VITAMIN D HIGH POTENCY) 25 MCG (1000 UT) capsule Take 1,000 Units by mouth daily.   Coenzyme Q10 (CO Q10) 100 MG CAPS Take 300 mg by mouth.   KRILL OIL PO Take by mouth.   No facility-administered encounter medications on file as of 07/31/2022.    Allergies (verified) Patient has no known allergies.   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    Arthritis    Clotting disorder (HCC)    leg artery aneurysm    Finger fracture, right    5th finger   High cholesterol    Hypertension    Kidney stones    Past Surgical History:  Procedure Laterality Date   BYPASS GRAFT POPLITEAL TO POPLITEAL Right 03/03/2016   Procedure: BYPASS RIGHT ABOVE  POPLITEAL TO BELOW POPLITEAL USING REVERSED RIGHT GREATER SAPHENOUS VEIN;  Surgeon: Elam Dutch, MD;  Location: [facility];  Service: Vascular;  Laterality: Right;   COLONOSCOPY  11/04/06   ELECTROCARDIOGRAM  03/16/03   EMBOLECTOMY Right 03/03/2016   Procedure: RIGHT ANTERIOR TIBIAL EMBOLECTOMY;  Surgeon: Elam Dutch, MD;  Location: [facility];  Service: Vascular;  Laterality: Right;   FALSE ANEURYSM REPAIR Right 03/03/2016   Procedure: LIGATION RIGHT  POPLITEAL ARTERY ANEURYSM;  Surgeon: Elam Dutch, MD;  Location: [facility];   Service: Vascular;  Laterality: Right;   LITHOTRIPSY     LITHOTRIPSY     OPEN REDUCTION INTERNAL FIXATION (ORIF) METACARPAL Right 10/08/2015   Procedure: OPEN REDUCTION INTERNAL FIXATION (ORIF) METACARPAL RIGHT [place];  Surgeon: Daryll Brod, MD;  Location: [facility];  Service: Orthopedics;  Laterality: Right;   POLYPECTOMY     Family History  Problem Relation Age of Onset   Colon cancer Neg Hx    Colon polyps Neg Hx    Esophageal cancer Neg Hx    Rectal cancer Neg Hx    Stomach cancer Neg Hx    Social History   Socioeconomic History   Marital status: Married    Spouse name: Not on file   Number of children: Not on file   Years of education: Not on file   Highest education level: Not on file  Occupational History   Not on file  Tobacco Use   Smoking status: Former   Smokeless tobacco: Never   Tobacco comments:    QUIT SMOKING IN 1982  Vaping Use   Vaping Use: Never used  Substance and Sexual Activity   Alcohol use: Yes    Alcohol/week: 1.0 standard drink of alcohol    Types: 1 Cans of beer per week    Comment: or less   Drug use: No   Sexual activity: Yes  Other Topics Concern   Not on file  Social History Narrative   Not on file   Social Determinants of Health   Financial Resource Strain: Low Risk  (07/31/2022)   Overall Financial Resource Strain (CARDIA)    Difficulty of Paying Living Expenses: Not hard at all  Food Insecurity: No Food Insecurity (07/31/2022)   Hunger Vital Sign    Worried About Running Out of Food in the Last Year: Never true    Ran Out of Food in the Last Year: Never true  Transportation Needs: No Transportation Needs (07/31/2022)   PRAPARE - Hydrologist (Medical): No    Lack of Transportation (Non-Medical): No  Physical Activity: Inactive (07/31/2022)   Exercise Vital Sign    Days of Exercise per Week: 0 days    Minutes of Exercise per Session: 0 min  Stress: No Stress Concern Present  (07/31/2022)   [place]    Feeling of Stress : Not at all  Social Connections: Not on file    Tobacco Counseling Counseling given: Not Answered Tobacco comments: [place]   Clinical Intake:  Pre-visit preparation completed: Yes  Pain : No/denies pain     Nutritional Status: BMI 25 -29 Overweight Nutritional Risks: None Diabetes: No  How often do you need to have someone help you when you read instructions, pamphlets, or other written materials from your doctor or pharmacy?: 1 - Never What is the last grade level you completed in school?: 10th grade  Diabetic? no  Interpreter Needed?: No  Information entered by :: NAllen LPN   Activities of Daily Living    07/31/2022    8:16 AM  In your present state of health, do you have any difficulty performing the following activities:  Hearing? 0  Vision? 0  Difficulty concentrating or making decisions? 0  Walking or climbing stairs? 0  Dressing or bathing? 0  Doing errands, shopping? 0  Preparing Food and eating ? N  Using the Toilet? N  In the past six months, have you accidently leaked urine? N  Do you have problems with loss of bowel control? N  Managing your Medications? N  Managing your Finances? N  Housekeeping or managing your Housekeeping? N    Patient Care Team: [name], MD as PCP - General  Indicate any recent Medical Services you may have received from other than [facility] providers in the past year (date may be approximate).     Assessment:   This is a routine wellness examination for Mike Wong.  Hearing/Vision screen Vision Screening - Comments:: Regular eye exams,   Dietary issues and exercise activities discussed: Current Exercise Habits: The patient does not participate in regular exercise at present   Goals Addressed             This Visit's Progress    Patient Stated       07/31/2022, no goals        Depression Screen    07/31/2022    8:15 AM 07/25/2021   10:37 AM 05/23/2020    1:25 PM 04/27/2019    8:25 AM 04/25/2018    8:58 AM 03/22/2017    2:50 PM 03/19/2016    9:07 AM  PHQ 2/9 Scores  PHQ - 2 Score 0 0 0 0 0 0 0  PHQ- 9 Score 0          Fall Risk    07/31/2022    8:15 AM 07/25/2021   10:36 AM 06/25/2020   10:14 AM 05/23/2020    1:24 PM 04/27/2019    8:25 AM  Fall Risk   Falls in the past year? 0 0 0 0 0  Number falls in past yr: 0 0     Injury with Fall? 0 0     Risk for fall due to : Medication side effect No Fall Risks No Fall Risks    Follow up Falls prevention discussed;Education provided;Falls evaluation completed Falls evaluation completed       FALL RISK PREVENTION PERTAINING TO THE HOME:  Any stairs in or around the home? Yes  If so, are there any without handrails? No  Home free of loose throw rugs in walkways, pet beds, electrical cords, etc? Yes  Adequate lighting in your home to reduce risk of falls? Yes   ASSISTIVE DEVICES UTILIZED TO PREVENT FALLS:  Life alert? No  Use of a cane, walker or w/c? No  Grab bars in the bathroom? No  Shower chair or bench in shower? Yes  Elevated toilet seat or a handicapped toilet? No   TIMED UP AND GO:  Was the test performed? No .      Cognitive Function:        07/31/2022    8:17 AM  6CIT Screen  What Year? 0 points  What month? 0 points  What time? 0 points  Count back from 20 0 points  Months in reverse 0 points  Repeat phrase 4 points  Total Score 4 points    Immunizations  Immunization History  Administered Date(s) Administered   PFIZER(Purple Top)SARS-COV-2 Vaccination 01/15/2020, 02/13/2020   Pneumococcal Conjugate-13 03/19/2015   Pneumococcal Polysaccharide-23 10/22/2010   Tdap 05/22/2008   Zoster, Live 10/22/2010    TDAP status: Due, Education has been provided regarding the importance of this vaccine. Advised may receive this vaccine at local pharmacy or Health Dept. Aware to provide a copy  of the vaccination record if obtained from local pharmacy or Health Dept. Verbalized acceptance and understanding.  Flu Vaccine status: Declined, Education has been provided regarding the importance of this vaccine but patient still declined. Advised may receive this vaccine at local pharmacy or Health Dept. Aware to provide a copy of the vaccination record if obtained from local pharmacy or Health Dept. Verbalized acceptance and understanding.  Pneumococcal vaccine status: Up to date  Covid-19 vaccine status: Completed vaccines  Qualifies for Shingles Vaccine? Yes   Zostavax completed Yes   Shingrix Completed?: No.    Education has been provided regarding the importance of this vaccine. Patient has been advised to call insurance company to determine out of pocket expense if they have not yet received this vaccine. Advised may also receive vaccine at local pharmacy or Health Dept. Verbalized acceptance and understanding.  Screening Tests Health Maintenance  Topic Date Due   Zoster Vaccines- Shingrix (1 of 2) Never done   Pneumonia Vaccine 51+ Years old (3 - PPSV23 or PCV20) 03/18/2016   TETANUS/TDAP  05/22/2018   COVID-19 Vaccine (3 - Pfizer series) 04/09/2020   INFLUENZA VACCINE  Never done   COLONOSCOPY (Pts 45-70yr Insurance coverage will need to be confirmed)  10/30/2024   Hepatitis C Screening  Completed   HPV VACCINES  Aged Out    Health Maintenance  Health Maintenance Due  Topic Date Due   Zoster Vaccines- Shingrix (1 of 2) Never done   Pneumonia Vaccine 74 Years old (338- PPSV23 or PCV20) 03/18/2016   TETANUS/TDAP  05/22/2018   COVID-19 Vaccine (3 - Pfizer series) 04/09/2020   INFLUENZA VACCINE  Never done    Colorectal cancer screening: Type of screening: Colonoscopy. Completed 10/31/2019. Repeat every 5 years  Lung Cancer Screening: (Low Dose CT Chest recommended if Age 74-80years, 30 pack-year currently smoking OR have quit w/in 15years.) does not qualify.   Lung  Cancer Screening Referral: no  Additional Screening:  Hepatitis C Screening: does qualify; Completed 03/19/2016  Vision Screening: Recommended annual ophthalmology exams for early detection of glaucoma and other disorders of the eye. Is the patient up to date with their annual eye exam?  Yes  Who is the provider or what is the name of the office in which the patient attends annual eye exams? Can't remember name If pt is not established with a provider, would they like to be referred to a provider to establish care? No .   Dental Screening: Recommended annual dental exams for proper oral hygiene  Community Resource Referral / Chronic Care Management: CRR required this visit?  No   CCM required this visit?  No      Plan:     I have personally reviewed and noted the following in the patient's chart:   Medical and social history Use of alcohol, tobacco or illicit drugs  Current medications and supplements including opioid prescriptions. Patient is not currently taking opioid prescriptions. Functional ability and status Nutritional status Physical activity Advanced directives List of other physicians Hospitalizations, surgeries, and ER visits in previous 12 months Vitals Screenings to include cognitive, depression, and  falls Referrals and appointments  In addition, I have reviewed and discussed with patient certain preventive protocols, quality metrics, and best practice recommendations. A written personalized care plan for preventive services as well as general preventive health recommendations were provided to patient.     Kellie Simmering, LPN   [id number]   Nurse Notes: none  Due to this being a virtual visit, the after visit summary with patients personalized plan was offered to patient via mail or my-chart. Patient would like to access on my-chart

## 2022-08-03 ENCOUNTER — Ambulatory Visit (INDEPENDENT_AMBULATORY_CARE_PROVIDER_SITE_OTHER): Payer: Medicare Other | Admitting: Family Medicine

## 2022-08-03 ENCOUNTER — Encounter: Payer: Self-pay | Admitting: Family Medicine

## 2022-08-03 VITALS — BP 130/76 | HR 72 | Temp 97.5°F | Ht 68.75 in | Wt 171.6 lb

## 2022-08-03 DIAGNOSIS — Z86010 Personal history of colonic polyps: Secondary | ICD-10-CM

## 2022-08-03 DIAGNOSIS — E785 Hyperlipidemia, unspecified: Secondary | ICD-10-CM | POA: Diagnosis not present

## 2022-08-03 DIAGNOSIS — Z8639 Personal history of other endocrine, nutritional and metabolic disease: Secondary | ICD-10-CM

## 2022-08-03 DIAGNOSIS — Z2821 Immunization not carried out because of patient refusal: Secondary | ICD-10-CM

## 2022-08-03 DIAGNOSIS — N401 Enlarged prostate with lower urinary tract symptoms: Secondary | ICD-10-CM

## 2022-08-03 DIAGNOSIS — I10 Essential (primary) hypertension: Secondary | ICD-10-CM | POA: Diagnosis not present

## 2022-08-03 MED ORDER — AMLODIPINE BESYLATE 10 MG PO TABS: ORAL_TABLET | ORAL | 3 refills | Status: DC

## 2022-08-03 MED ORDER — ATORVASTATIN CALCIUM 10 MG PO TABS: ORAL_TABLET | ORAL | 3 refills | Status: DC

## 2022-08-03 NOTE — Progress Notes (Signed)
Complete Med check plus exam  Patient: Mike Wong   DOB: September 08, 1946   76 y.o. Male  MRN: [id number]  Subjective:    Mike Wong is a 76 y.o. male who presents today for a complete med check plus exam. He reports consuming a general and low fat diet.  Staying active 30 minutes a day  He generally feels well. He reports sleeping well. He does have additional problems to discuss today.  He does complain of foul taste in his mouth.  He does use dentures.  He has not changed his dentures or adhesive.  No fever, chills, sore throat, earache, cough or congestion.  No allergies.  Does not smoke.  Only occasionally drinks alcohol.  He does have BPH symptoms of hesitancy and states that he is using an over-the-counter medication with good results.  He continues on Lipitor as well as Norvasc.  He does take a multivitamin and aspirin.  Continues to work and has no plans on quitting.   Most recent fall risk assessment:    07/31/2022    8:15 AM  Fall Risk   Falls in the past year? 0  Number falls in past yr: 0  Injury with Fall? 0  Risk for fall due to : Medication side effect  Follow up Falls prevention discussed;Education provided;Falls evaluation completed     Most recent depression screenings:    07/31/2022    8:15 AM 07/25/2021   10:37 AM  PHQ 2/9 Scores  PHQ - 2 Score 0 0  PHQ- 9 Score 0       Patient Active Problem List   Diagnosis Date Noted   History of colonic polyps 03/13/2014   Hyperlipidemia with target LDL less than 130 03/13/2014   History of vitamin D deficiency 03/13/2014   Benign prostatic hyperplasia with lower urinary tract symptoms 03/13/2014   History of renal stone 03/13/2014   Essential hypertension 10/20/2012   Past Medical History:  Diagnosis Date   Aneurysm artery, 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   RT LEG    Arthritis    Clotting disorder ([place])    leg artery aneurysm    Finger fracture, right    5th finger   High cholesterol    Hypertension     Kidney stones    Past Surgical History:  Procedure Laterality Date   BYPASS GRAFT POPLITEAL TO POPLITEAL Right 03/03/2016   Procedure: BYPASS RIGHT ABOVE POPLITEAL TO BELOW POPLITEAL USING REVERSED RIGHT GREATER SAPHENOUS VEIN;  Surgeon: Elam Dutch, MD;  Location: [facility];  Service: Vascular;  Laterality: Right;   COLONOSCOPY  11/04/06   ELECTROCARDIOGRAM  03/16/03   EMBOLECTOMY Right 03/03/2016   Procedure: RIGHT ANTERIOR TIBIAL EMBOLECTOMY;  Surgeon: Elam Dutch, MD;  Location: [facility];  Service: Vascular;  Laterality: Right;   FALSE ANEURYSM REPAIR Right 03/03/2016   Procedure: LIGATION RIGHT  POPLITEAL ARTERY ANEURYSM;  Surgeon: Elam Dutch, MD;  Location: [facility];  Service: Vascular;  Laterality: Right;   LITHOTRIPSY     LITHOTRIPSY     OPEN REDUCTION INTERNAL FIXATION (ORIF) METACARPAL Right 10/08/2015   Procedure: OPEN REDUCTION INTERNAL FIXATION (ORIF) METACARPAL RIGHT [place];  Surgeon: Daryll Brod, MD;  Location: [facility];  Service: Orthopedics;  Laterality: Right;   POLYPECTOMY     Social History   Tobacco Use   Smoking status: Former   Smokeless tobacco: Never   Tobacco comments:    QUIT SMOKING IN 1982  Vaping Use   Vaping Use:  Never used  Substance Use Topics   Alcohol use: Yes    Alcohol/week: 1.0 standard drink of alcohol    Types: 1 Cans of beer per week    Comment: or less   Drug use: No   Family History  Problem Relation Age of Onset   Colon cancer Neg Hx    Colon polyps Neg Hx    Esophageal cancer Neg Hx    Rectal cancer Neg Hx    Stomach cancer Neg Hx    No Known Allergies    Patient Care Team: [name], MD as PCP - General   Outpatient Medications Prior to Visit  Medication Sig   aspirin 81 MG chewable tablet Chew 81 mg by mouth daily.   Cholecalciferol (VITAMIN D HIGH POTENCY) 25 MCG (1000 UT) capsule Take 1,000 Units by mouth daily.   Coenzyme Q10 (CO Q10) 100 MG CAPS Take 300 mg by mouth.   KRILL OIL PO  Take by mouth.   [DISCONTINUED] amLODipine (NORVASC) 10 MG tablet TAKE 1 TABLET(10 MG) BY MOUTH DAILY   [DISCONTINUED] atorvastatin (LIPITOR) 10 MG tablet TAKE 1 TABLET(10 MG) BY MOUTH DAILY   No facility-administered medications prior to visit.    Review of Systems  All other systems reviewed and are negative.         Objective:     BP 130/76   Pulse 72   Temp (!) 97.5 F (36.4 C)   Ht 5' 8.75" (1.746 m)   Wt 171 lb 9.6 oz (77.8 kg)   SpO2 98%   BMI 25.53 kg/m  BP Readings from Last 3 Encounters:  08/03/22 130/76  07/25/21 112/70  06/25/20 120/82   Wt Readings from Last 3 Encounters:  08/03/22 171 lb 9.6 oz (77.8 kg)  07/31/22 175 lb (79.4 kg)  07/25/21 166 lb 12.8 oz (75.7 kg)      Physical Exam  Alert and in no distress. Tympanic membranes and canals are normal. Pharyngeal area is normal. Neck is supple without adenopathy or thyromegaly. Cardiac exam shows a regular sinus rhythm without murmurs or gallops. Lungs are clear to auscultation.  Last CBC Lab Results  Component Value Date   WBC 6.3 07/25/2021   HGB 13.6 07/25/2021   HCT 40.3 07/25/2021   MCV 93 07/25/2021   MCH 31.4 07/25/2021   RDW 12.6 07/25/2021   PLT 276 24/23/5361   Last metabolic panel Lab Results  Component Value Date   GLUCOSE 101 (H) 07/25/2021   NA 142 07/25/2021   K 4.0 07/25/2021   CL 105 07/25/2021   CO2 24 07/25/2021   BUN 16 07/25/2021   CREATININE 1.06 07/25/2021   EGFR 74 07/25/2021   CALCIUM 9.5 07/25/2021   PROT 6.8 07/25/2021   ALBUMIN 4.5 07/25/2021   LABGLOB 2.3 07/25/2021   AGRATIO 2.0 07/25/2021   BILITOT 0.5 07/25/2021   ALKPHOS 92 07/25/2021   AST 21 07/25/2021   ALT 20 07/25/2021   ANIONGAP 10 11/09/2017   Last lipids Lab Results  Component Value Date   CHOL 175 07/25/2021   HDL 61 07/25/2021   LDLCALC 92 07/25/2021   TRIG 127 07/25/2021   CHOLHDL 2.9 07/25/2021        Assessment & Plan:    Essential hypertension - Plan: CBC with  Differential/Platelet, Comprehensive metabolic panel, amLODipine (NORVASC) 10 MG tablet  Benign prostatic hyperplasia with lower urinary tract symptoms, symptom details unspecified - Plan: CBC with Differential/Platelet, Comprehensive metabolic panel  History of colonic polyps -  Plan: CBC with Differential/Platelet, Comprehensive metabolic panel  History of vitamin D deficiency  Hyperlipidemia with target LDL less than 130 - Plan: Lipid panel, atorvastatin (LIPITOR) 10 MG tablet  Immunization refused  Immunization History  Administered Date(s) Administered   PFIZER(Purple Top)SARS-COV-2 Vaccination 01/15/2020, 02/13/2020   Pneumococcal Conjugate-13 03/19/2015   Pneumococcal Polysaccharide-23 10/22/2010   Tdap 05/22/2008   Zoster, Live 10/22/2010    Health Maintenance  Topic Date Due   Zoster Vaccines- Shingrix (1 of 2) Never done   TETANUS/TDAP  05/22/2018   COVID-19 Vaccine (3 - Pfizer series) 04/09/2020   INFLUENZA VACCINE  02/14/2023 (Originally 06/16/2022)   Pneumonia Vaccine 53+ Years old (81 - PPSV23 or PCV20) 08/11/2024 (Originally 03/18/2016)   COLONOSCOPY (Pts 45-80yr Insurance coverage will need to be confirmed)  10/30/2024   Hepatitis C Screening  Completed   HPV VACCINES  Aged Out    Discussed health benefits of physical activity, and encouraged him to engage in regular exercise appropriate for his age and condition.  Problem List Items Addressed This Visit     Benign prostatic hyperplasia with lower urinary tract symptoms   Relevant Orders   CBC with Differential/Platelet   Comprehensive metabolic panel   Essential hypertension - Primary   Relevant Medications   amLODipine (NORVASC) 10 MG tablet   atorvastatin (LIPITOR) 10 MG tablet   Other Relevant Orders   CBC with Differential/Platelet   Comprehensive metabolic panel   History of colonic polyps   Relevant Orders   CBC with Differential/Platelet   Comprehensive metabolic panel   History of vitamin D  deficiency   Hyperlipidemia with target LDL less than 130   Relevant Medications   amLODipine (NORVASC) 10 MG tablet   atorvastatin (LIPITOR) 10 MG tablet   Other Relevant Orders   Lipid panel   Other Visit Diagnoses     Immunization refused         Recommend he stop aspirin. Return in about 1 year (around 08/04/2023) for fasting awv/cpe.     JJill Alexanders MD

## 2022-08-03 NOTE — Patient Instructions (Signed)
Health Maintenance, Male Adopting a healthy lifestyle and getting preventive care are important in promoting health and wellness. Ask your health care provider about: The right schedule for you to have regular tests and exams. Things you can do on your own to prevent diseases and keep yourself healthy. What should I know about diet, weight, and exercise? Eat a healthy diet  Eat a diet that includes plenty of vegetables, fruits, low-fat dairy products, and lean protein. Do not eat a lot of foods that are high in solid fats, added sugars, or sodium. Maintain a healthy weight Body mass index (BMI) is a measurement that can be used to identify possible weight problems. It estimates body fat based on height and weight. Your health care provider can help determine your BMI and help you achieve or maintain a healthy weight. Get regular exercise Get regular exercise. This is one of the most important things you can do for your health. Most adults should: Exercise for at least 150 minutes each week. The exercise should increase your heart rate and make you sweat (moderate-intensity exercise). Do strengthening exercises at least twice a week. This is in addition to the moderate-intensity exercise. Spend less time sitting. Even light physical activity can be beneficial. Watch cholesterol and blood lipids Have your blood tested for lipids and cholesterol at 74 years of age, then have this test every 5 years. You may need to have your cholesterol levels checked more often if: Your lipid or cholesterol levels are high. You are older than 74 years of age. You are at high risk for heart disease. What should I know about cancer screening? Many types of cancers can be detected early and may often be prevented. Depending on your health history and family history, you may need to have cancer screening at various ages. This may include screening for: Colorectal cancer. Prostate cancer. Skin cancer. Lung  cancer. What should I know about heart disease, diabetes, and high blood pressure? Blood pressure and heart disease High blood pressure causes heart disease and increases the risk of stroke. This is more likely to develop in people who have high blood pressure readings or are overweight. Talk with your health care provider about your target blood pressure readings. Have your blood pressure checked: Every 3-5 years if you are 18-39 years of age. Every year if you are 40 years old or older. If you are between the ages of 65 and 75 and are a current or former smoker, ask your health care provider if you should have a one-time screening for abdominal aortic aneurysm (AAA). Diabetes Have regular diabetes screenings. This checks your fasting blood sugar level. Have the screening done: Once every three years after age 45 if you are at a normal weight and have a low risk for diabetes. More often and at a younger age if you are overweight or have a high risk for diabetes. What should I know about preventing infection? Hepatitis B If you have a higher risk for hepatitis B, you should be screened for this virus. Talk with your health care provider to find out if you are at risk for hepatitis B infection. Hepatitis C Blood testing is recommended for: Everyone born from 1945 through 1965. Anyone with known risk factors for hepatitis C. Sexually transmitted infections (STIs) You should be screened each year for STIs, including gonorrhea and chlamydia, if: You are sexually active and are younger than 74 years of age. You are older than 74 years of age and your   health care provider tells you that you are at risk for this type of infection. Your sexual activity has changed since you were last screened, and you are at increased risk for chlamydia or gonorrhea. Ask your health care provider if you are at risk. Ask your health care provider about whether you are at high risk for HIV. Your health care provider  may recommend a prescription medicine to help prevent HIV infection. If you choose to take medicine to prevent HIV, you should first get tested for HIV. You should then be tested every 3 months for as long as you are taking the medicine. Follow these instructions at home: Alcohol use Do not drink alcohol if your health care provider tells you not to drink. If you drink alcohol: Limit how much you have to 0-2 drinks a day. Know how much alcohol is in your drink. In the [place] one drink equals one 12 oz bottle of beer (355 mL), one 5 oz glass of wine (148 mL), or one 1 oz glass of hard liquor (44 mL). Lifestyle Do not use any products that contain nicotine or tobacco. These products include cigarettes, chewing tobacco, and vaping devices, such as e-cigarettes. If you need help quitting, ask your health care provider. Do not use street drugs. Do not share needles. Ask your health care provider for help if you need support or information about quitting drugs. General instructions Schedule regular health, dental, and eye exams. Stay current with your vaccines. Tell your health care provider if: You often feel depressed. You have ever been abused or do not feel safe at home. Summary Adopting a healthy lifestyle and getting preventive care are important in promoting health and wellness. Follow your health care provider's instructions about healthy diet, exercising, and getting tested or screened for diseases. Follow your health care provider's instructions on monitoring your cholesterol and blood pressure. This information is not intended to replace advice given to you by your health care provider. Make sure you discuss any questions you have with your health care provider. Document Revised: 03/24/2021 Document Reviewed: 03/24/2021 Elsevier Patient Education  2023 Elsevier Inc.  

## 2022-08-04 LAB — CBC WITH DIFFERENTIAL/PLATELET
Basophils Absolute: 0 10*3/uL (ref 0.0–0.2)
Basos: 1 %
EOS (ABSOLUTE): 0.1 10*3/uL (ref 0.0–0.4)
Eos: 1 %
Hematocrit: 40.9 % (ref 37.5–51.0)
Hemoglobin: 13.4 g/dL (ref 13.0–17.7)
Immature Grans (Abs): 0 10*3/uL (ref 0.0–0.1)
Immature Granulocytes: 0 %
Lymphocytes Absolute: 1 10*3/uL (ref 0.7–3.1)
Lymphs: 16 %
MCH: 28.9 pg (ref 26.6–33.0)
MCHC: 32.8 g/dL (ref 31.5–35.7)
MCV: 88 fL (ref 79–97)
Monocytes Absolute: 0.4 10*3/uL (ref 0.1–0.9)
Monocytes: 7 %
Neutrophils Absolute: 4.5 10*3/uL (ref 1.4–7.0)
Neutrophils: 75 %
Platelets: 317 10*3/uL (ref 150–450)
RBC: 4.63 x10E6/uL (ref 4.14–5.80)
RDW: 13 % (ref 11.6–15.4)
WBC: 6 10*3/uL (ref 3.4–10.8)

## 2022-08-04 LAB — COMPREHENSIVE METABOLIC PANEL
ALT: 16 IU/L (ref 0–44)
AST: 17 IU/L (ref 0–40)
Albumin/Globulin Ratio: 1.8 (ref 1.2–2.2)
Albumin: 4.6 g/dL (ref 3.8–4.8)
Alkaline Phosphatase: 106 IU/L (ref 44–121)
BUN/Creatinine Ratio: 14 (ref 10–24)
BUN: 14 mg/dL (ref 8–27)
Bilirubin Total: 0.6 mg/dL (ref 0.0–1.2)
CO2: 20 mmol/L (ref 20–29)
Calcium: 9.7 mg/dL (ref 8.6–10.2)
Chloride: 102 mmol/L (ref 96–106)
Creatinine, Ser: 1.02 mg/dL (ref 0.76–1.27)
Globulin, Total: 2.6 g/dL (ref 1.5–4.5)
Glucose: 107 mg/dL — ABNORMAL HIGH (ref 70–99)
Potassium: 4.1 mmol/L (ref 3.5–5.2)
Sodium: 140 mmol/L (ref 134–144)
Total Protein: 7.2 g/dL (ref 6.0–8.5)
eGFR: 77 mL/min/{1.73_m2} (ref >59)

## 2022-08-04 LAB — LIPID PANEL
Chol/HDL Ratio: 2.6 ratio (ref 0.0–5.0)
Cholesterol, Total: 179 mg/dL (ref 100–199)
HDL: 70 mg/dL (ref >39)
LDL Chol Calc (NIH): 90 mg/dL (ref 0–99)
Triglycerides: 107 mg/dL (ref 0–149)
VLDL Cholesterol Cal: 19 mg/dL (ref 5–40)

## 2022-08-05 NOTE — Progress Notes (Signed)
Lvm for pt to all back for labs. [place]

## 2022-08-18 DIAGNOSIS — L308 Other specified dermatitis: Secondary | ICD-10-CM | POA: Diagnosis not present

## 2022-08-25 ENCOUNTER — Encounter: Payer: Self-pay | Admitting: Internal Medicine

## 2022-09-07 ENCOUNTER — Encounter: Payer: Self-pay | Admitting: Internal Medicine

## 2022-10-17 ENCOUNTER — Other Ambulatory Visit: Payer: Self-pay | Admitting: Family Medicine

## 2022-10-17 DIAGNOSIS — E785 Hyperlipidemia, unspecified: Secondary | ICD-10-CM

## 2022-10-17 DIAGNOSIS — I10 Essential (primary) hypertension: Secondary | ICD-10-CM

## 2022-11-03 ENCOUNTER — Encounter: Payer: Self-pay | Admitting: Internal Medicine

## 2022-11-18 ENCOUNTER — Telehealth: Payer: Self-pay | Admitting: Family Medicine

## 2022-11-18 DIAGNOSIS — Z95828 Presence of other vascular implants and grafts: Secondary | ICD-10-CM

## 2022-11-18 DIAGNOSIS — I724 Aneurysm of artery of lower extremity: Secondary | ICD-10-CM

## 2022-11-18 DIAGNOSIS — Z87891 Personal history of nicotine dependence: Secondary | ICD-10-CM

## 2022-11-18 NOTE — Telephone Encounter (Signed)
Referral order placed.

## 2022-11-18 NOTE — Telephone Encounter (Signed)
Please call  He had surgery with [facility] and it is time for a follow up He needs new referral to them since it has been 3 years

## 2022-11-20 ENCOUNTER — Other Ambulatory Visit: Payer: Self-pay | Admitting: *Deleted

## 2022-11-20 DIAGNOSIS — I739 Peripheral vascular disease, unspecified: Secondary | ICD-10-CM

## 2022-11-30 ENCOUNTER — Ambulatory Visit (HOSPITAL_COMMUNITY)
Admission: RE | Admit: 2022-11-30 | Discharge: 2022-11-30 | Disposition: A | Discharge status: Home / Self Care | Payer: Medicare Other | Source: Ambulatory Visit | Attending: Surgery | Admitting: Surgery

## 2022-11-30 ENCOUNTER — Ambulatory Visit (INDEPENDENT_AMBULATORY_CARE_PROVIDER_SITE_OTHER)
Admission: RE | Admit: 2022-11-30 | Discharge: 2022-11-30 | Disposition: A | Discharge status: Home / Self Care | Payer: Medicare Other | Source: Ambulatory Visit | Attending: Surgery | Admitting: Surgery

## 2022-11-30 DIAGNOSIS — I739 Peripheral vascular disease, unspecified: Secondary | ICD-10-CM

## 2022-11-30 LAB — VAS US ABI WITH/WO TBI
Left ABI: 1.12
Right ABI: 0.92

## 2022-11-30 NOTE — Progress Notes (Signed)
[facility] PROGRESS NOTE  ASSESSMENT / PLAN: Mike Wong is a 77 y.o. male with history of right to below-knee popliteal artery bypass with ipsilateral reverse greater saphenous vein for right popliteal artery aneurysm.  Patient returns for lapsed surveillance.  His noninvasive testing today is reassuring.  He has no symptoms relatable to his bypass.  I encouraged him to maintain surveillance going forward.  Will see him again in a year with an ankle-brachial index and a duplex ultrasound of the bypass.  SUBJECTIVE: No complaints.  Patient lost to follow-up during [place] pandemic.  He returns to care to reestablish with a surgeon in our group.  Previously Dr. Oneida Alar had performed a right above-knee popliteal to below-knee popliteal artery bypass with ipsilateral reverse greater saphenous vein for right popliteal artery aneurysm.  Good technical result was achieved.  Good patency was noted on surveillance.  He has no symptoms today.  OBJECTIVE: BP 128/75 (BP Location: Left Arm, Patient Position: Sitting, Cuff Size: Normal)   Pulse 71   Temp 98.2 F (36.8 C)   Resp 20   Ht 5' 8.75" (1.746 m)   Wt 175 lb (79.4 kg)   SpO2 94%   BMI 26.03 kg/m   Appearing gentleman in no acute distress Regular rate and rhythm Unlabored breathing well-healed scars from prior bypass 2+ posterior tibial pulses bilaterally     Latest Ref Rng & Units 08/03/2022   10:22 AM 07/25/2021   11:31 AM 05/23/2020    2:20 PM  CBC  WBC 3.4 - 10.8 x10E3/uL 6.0  6.3  6.5   Hemoglobin 13.0 - 17.7 g/dL 13.4  13.6  13.9   Hematocrit 37.5 - 51.0 % 40.9  40.3  42.1   Platelets 150 - 450 x10E3/uL 317  276  274         Latest Ref Rng & Units 08/03/2022   10:22 AM 07/25/2021   11:31 AM 05/23/2020    2:20 PM  CMP  Glucose 70 - 99 mg/dL 107  101  117   BUN 8 - 27 mg/dL '14  16  16   '$ Creatinine 0.76 - 1.27 mg/dL 1.02  1.06  1.13   Sodium 134 - 144 mmol/L 140  142  138   Potassium 3.5 - 5.2  mmol/L 4.1  4.0  4.1   Chloride 96 - 106 mmol/L 102  105  102   CO2 20 - 29 mmol/L '20  24  22   '$ Calcium 8.6 - 10.2 mg/dL 9.7  9.5  9.6   Total Protein 6.0 - 8.5 g/dL 7.2  6.8  6.7   Total Bilirubin 0.0 - 1.2 mg/dL 0.6  0.5  0.2   Alkaline Phos 44 - 121 IU/L 106  92  99   AST 0 - 40 IU/L '17  21  20   '$ ALT 0 - 44 IU/L '16  20  24    '$ Arterial duplex shows widely patent right pop pop bypass   +-------+-----------+-----------+------------+------------+  ABI/TBIToday's ABIToday's TBIPrevious ABIPrevious TBI  +-------+-----------+-----------+------------+------------+  Right 0.92       0.48       0.96        0.43          +-------+-----------+-----------+------------+------------+  Left  1.12       0.67       1.15        0.87          +-------+-----------+-----------+------------+------------+    Yevonne Aline. Stanford Breed, MD  FACS [facility] Phone Number: [phone] 12/01/2022 4:58 PM

## 2022-12-01 ENCOUNTER — Encounter: Payer: Self-pay | Admitting: Vascular Surgery

## 2022-12-01 ENCOUNTER — Ambulatory Visit (INDEPENDENT_AMBULATORY_CARE_PROVIDER_SITE_OTHER): Payer: Medicare Other | Admitting: Vascular Surgery

## 2022-12-01 VITALS — BP 128/75 | HR 71 | Temp 98.2°F | Resp 20 | Ht 68.75 in | Wt 175.0 lb

## 2022-12-01 DIAGNOSIS — Z95828 Presence of other vascular implants and grafts: Secondary | ICD-10-CM | POA: Diagnosis not present

## 2023-08-09 ENCOUNTER — Ambulatory Visit: Payer: Medicare Other | Admitting: Family Medicine

## 2023-08-17 ENCOUNTER — Encounter: Payer: Self-pay | Admitting: Family Medicine

## 2023-08-17 ENCOUNTER — Other Ambulatory Visit: Payer: Self-pay

## 2023-08-17 ENCOUNTER — Telehealth: Payer: Self-pay | Admitting: Family Medicine

## 2023-08-17 ENCOUNTER — Ambulatory Visit (INDEPENDENT_AMBULATORY_CARE_PROVIDER_SITE_OTHER): Payer: Medicare Other | Admitting: Family Medicine

## 2023-08-17 VITALS — BP 124/78 | HR 77 | Ht 69.5 in | Wt 173.0 lb

## 2023-08-17 DIAGNOSIS — E785 Hyperlipidemia, unspecified: Secondary | ICD-10-CM | POA: Diagnosis not present

## 2023-08-17 DIAGNOSIS — Z8639 Personal history of other endocrine, nutritional and metabolic disease: Secondary | ICD-10-CM

## 2023-08-17 DIAGNOSIS — Z87442 Personal history of urinary calculi: Secondary | ICD-10-CM | POA: Diagnosis not present

## 2023-08-17 DIAGNOSIS — I10 Essential (primary) hypertension: Secondary | ICD-10-CM | POA: Diagnosis not present

## 2023-08-17 DIAGNOSIS — Z2821 Immunization not carried out because of patient refusal: Secondary | ICD-10-CM

## 2023-08-17 DIAGNOSIS — N401 Enlarged prostate with lower urinary tract symptoms: Secondary | ICD-10-CM

## 2023-08-17 DIAGNOSIS — Z860100 Personal history of colon polyps, unspecified: Secondary | ICD-10-CM

## 2023-08-17 MED ORDER — TAMSULOSIN HCL 0.4 MG PO CAPS
0.4000 mg | ORAL_CAPSULE | Freq: Every day | ORAL | 1 refills | Status: DC
Start: 2023-08-17 — End: 2023-08-17

## 2023-08-17 MED ORDER — ATORVASTATIN CALCIUM 10 MG PO TABS: ORAL_TABLET | ORAL | 3 refills | Status: DC

## 2023-08-17 MED ORDER — TAMSULOSIN HCL 0.4 MG PO CAPS: 0.4000 mg | ORAL_CAPSULE | Freq: Every day | ORAL | 1 refills | Status: DC

## 2023-08-17 MED ORDER — AMLODIPINE BESYLATE 10 MG PO TABS: ORAL_TABLET | ORAL | 3 refills | Status: DC

## 2023-08-17 NOTE — Telephone Encounter (Signed)
Rx resent.

## 2023-08-17 NOTE — Progress Notes (Signed)
Mike Wong is a 77 y.o. male who presents for annual wellness visit and follow-up on chronic medical conditions.  He has difficulty with nocturia stating he gets up 3 times per night and have done this for several years.  He does not complain of hesitancy, decreased stream, incomplete emptying or urgency symptoms.  He does have a history of colonic polyp and is scheduled for follow-up colonoscopy in 2025.  Continues on amlodipine as well as atorvastatin and having no difficulty with that.  Also has a history of vitamin D deficiency but does take a multivitamin.  Does have a history of renal stone but none recently.  He is not interested in immunizations.   Immunizations and Health Maintenance Immunization History  Administered Date(s) Administered   PFIZER(Purple Top)SARS-COV-2 Vaccination 01/15/2020, 02/13/2020   Pneumococcal Conjugate-13 03/19/2015   Pneumococcal Polysaccharide-23 10/22/2010   Tdap 05/22/2008   Zoster, Live 10/22/2010   Health Maintenance Due  Topic Date Due   Zoster Vaccines- Shingrix (1 of 2) 02/08/1998   DTaP/Tdap/Td (2 - Td or Tdap) 05/22/2018    Last colonoscopy: Dec 2020 Last PSA: n/a Dentist: Dr. Carola Frost Ophtho: Dr. Gelene Mink  Exercise: light weight lifting 15 min in morning and night   Other doctors caring for patient include:  Advanced Directives:  none  Depression screen:  See questionnaire below.        08/17/2023    1:38 PM 07/31/2022    8:15 AM 07/25/2021   10:37 AM 05/23/2020    1:25 PM 04/27/2019    8:25 AM  Depression screen PHQ 2/9  Decreased Interest 0 0 0 0 0  Down, Depressed, Hopeless 0 0 0 0 0  PHQ - 2 Score 0 0 0 0 0  Altered sleeping  0     Tired, decreased energy  0     Change in appetite  0     Feeling bad or failure about yourself   0     Trouble concentrating  0     Moving slowly or fidgety/restless  0     Suicidal thoughts  0     PHQ-9 Score  0     Difficult doing work/chores  Not difficult at all        Fall Screen: See Questionaire below.      08/17/2023    1:37 PM 07/31/2022    8:15 AM 07/25/2021   10:36 AM 06/25/2020   10:14 AM 05/23/2020    1:24 PM  Fall Risk   Falls in the past year? 0 0 0 0 0  Number falls in past yr: 0 0 0    Injury with Fall? 0 0 0    Risk for fall due to :  Medication side effect No Fall Risks No Fall Risks   Follow up  Falls prevention discussed;Education provided;Falls evaluation completed Falls evaluation completed      ADL screen:  See questionnaire below.  Functional Status Survey: Is the patient deaf or have difficulty hearing?: No Does the patient have difficulty seeing, even when wearing glasses/contacts?: No Does the patient have difficulty concentrating, remembering, or making decisions?: No Does the patient have difficulty walking or climbing stairs?: No Does the patient have difficulty dressing or bathing?: No Does the patient have difficulty doing errands alone such as visiting a doctor's office or shopping?: No   Review of Systems  Constitutional: -, -unexpected weight change, -anorexia, -fatigue Allergy: -sneezing, -itching, -congestion Dermatology: denies changing moles, rash, lumps ENT: -runny nose, -  ear pain, -sore throat,  Cardiology:  -chest pain, -palpitations, -orthopnea, Respiratory: -cough, -shortness of breath, -dyspnea on exertion, -wheezing,  Gastroenterology: -abdominal pain, -nausea, -vomiting, -diarrhea, -constipation, -dysphagia Hematology: -bleeding or bruising problems Musculoskeletal: -arthralgias, -myalgias, -joint swelling, -back pain, - Ophthalmology: -vision changes,  Urology: -dysuria, -difficulty urinating,  -urinary frequency, -urgency, incontinence Neurology: -, -numbness, , -memory loss, -falls, -dizziness    PHYSICAL EXAM:  BP 124/78   Pulse 77   Ht 5' 9.5" (1.765 m)   Wt 173 lb (78.5 kg)   SpO2 97%   BMI 25.18 kg/m   General Appearance: Alert, cooperative, no distress, appears stated  age Head: Normocephalic, without obvious abnormality, atraumatic Eyes: PERRL, conjunctiva/corneas clear, EOM's intact Ears: Normal TM's and external ear canals Nose: Nares normal, mucosa normal, no drainage or sinus   tenderness Throat: Lips, mucosa, and tongue normal; teeth and gums normal Neck: Supple, no lymphadenopathy, thyroid:no enlargement/tenderness/nodules; no carotid bruit or JVD Lungs: Clear to auscultation bilaterally without wheezes, rales or ronchi; respirations unlabored Heart: Regular rate and rhythm, S1 and S2 normal, no murmur, rub or gallop  Skin: Skin color, texture, turgor normal, no rashes or lesions Lymph nodes: Cervical, supraclavicular, and axillary nodes normal Neurologic: CNII-XII intact, normal strength, sensation and gait; reflexes 2+ and symmetric throughout   Psych: Normal mood, affect, hygiene and grooming  ASSESSMENT/PLAN: Hyperlipidemia with target LDL less than 130 - Plan: atorvastatin (LIPITOR) 10 MG tablet, Lipid panel  History of vitamin D deficiency  History of renal stone  History of colonic polyps  Essential hypertension - Plan: amLODipine (NORVASC) 10 MG tablet, CBC with Differential/Platelet, Comprehensive metabolic panel  Benign prostatic hyperplasia with lower urinary tract symptoms, symptom details unspecified - Plan: tamsulosin (FLOMAX) 0.4 MG CAPS capsule  Immunization refused    Discussed  Immunization recommendations discussed.  Colonoscopy recommendations reviewed. He is to send me a message on MyChart as to how he is doing on his Flomax.  Medicare Attestation I have personally reviewed: The patient's medical and social history Their use of alcohol, tobacco or illicit drugs Their current medications and supplements The patient's functional ability including ADLs,fall risks, home safety risks, cognitive, and hearing and visual impairment Diet and physical activities Evidence for depression or mood disorders  The patient's  weight, height, and BMI have been recorded in the chart.  I have made referrals, counseling, and provided education to the patient based on review of the above and I have provided the patient with a written personalized care plan for preventive services.     Sharlot Gowda, MD   08/17/2023

## 2023-08-17 NOTE — Patient Instructions (Signed)
Call me in a month and let me know how the new medicine is working to help BP okay

## 2023-08-17 NOTE — Telephone Encounter (Signed)
Pt needs his flomax changed to  [id number] - [place], [id number] - [address] AT [address] & [facility] [address]

## 2023-08-18 LAB — CBC WITH DIFFERENTIAL/PLATELET
Basophils Absolute: 0 10*3/uL (ref 0.0–0.2)
Basos: 1 %
EOS (ABSOLUTE): 0.1 10*3/uL (ref 0.0–0.4)
Eos: 1 %
Hematocrit: 34.4 % — ABNORMAL LOW (ref 37.5–51.0)
Hemoglobin: 9.9 g/dL — ABNORMAL LOW (ref 13.0–17.7)
Immature Grans (Abs): 0 10*3/uL (ref 0.0–0.1)
Immature Granulocytes: 0 %
Lymphocytes Absolute: 1.1 10*3/uL (ref 0.7–3.1)
Lymphs: 18 %
MCH: 22.4 pg — ABNORMAL LOW (ref 26.6–33.0)
MCHC: 28.8 g/dL — ABNORMAL LOW (ref 31.5–35.7)
MCV: 78 fL — ABNORMAL LOW (ref 79–97)
Monocytes Absolute: 0.4 10*3/uL (ref 0.1–0.9)
Monocytes: 7 %
Neutrophils Absolute: 4.5 10*3/uL (ref 1.4–7.0)
Neutrophils: 73 %
Platelets: 417 10*3/uL (ref 150–450)
RBC: 4.42 x10E6/uL (ref 4.14–5.80)
RDW: 17 % — ABNORMAL HIGH (ref 11.6–15.4)
WBC: 6.2 10*3/uL (ref 3.4–10.8)

## 2023-08-18 LAB — COMPREHENSIVE METABOLIC PANEL
ALT: 13 IU/L (ref 0–44)
AST: 19 IU/L (ref 0–40)
Albumin: 4.5 g/dL (ref 3.8–4.8)
Alkaline Phosphatase: 86 IU/L (ref 44–121)
BUN/Creatinine Ratio: 11 (ref 10–24)
BUN: 12 mg/dL (ref 8–27)
Bilirubin Total: 0.4 mg/dL (ref 0.0–1.2)
CO2: 23 mmol/L (ref 20–29)
Calcium: 9.5 mg/dL (ref 8.6–10.2)
Chloride: 103 mmol/L (ref 96–106)
Creatinine, Ser: 1.07 mg/dL (ref 0.76–1.27)
Globulin, Total: 2.2 g/dL (ref 1.5–4.5)
Glucose: 102 mg/dL — ABNORMAL HIGH (ref 70–99)
Potassium: 4.3 mmol/L (ref 3.5–5.2)
Sodium: 141 mmol/L (ref 134–144)
Total Protein: 6.7 g/dL (ref 6.0–8.5)
eGFR: 72 mL/min/{1.73_m2} (ref >59)

## 2023-08-18 LAB — LIPID PANEL
Cholesterol, Total: 178 mg/dL (ref 100–199)
HDL: 68 mg/dL (ref >39)
LDL CALC COMMENT:: 2.6 ratio (ref 0.0–5.0)
LDL Chol Calc (NIH): 92 mg/dL (ref 0–99)
Triglycerides: 98 mg/dL (ref 0–149)
VLDL Cholesterol Cal: 18 mg/dL (ref 5–40)

## 2023-08-25 ENCOUNTER — Encounter: Payer: Self-pay | Admitting: Family Medicine

## 2023-08-25 ENCOUNTER — Ambulatory Visit (INDEPENDENT_AMBULATORY_CARE_PROVIDER_SITE_OTHER): Payer: Medicare Other | Admitting: Family Medicine

## 2023-08-25 VITALS — BP 126/76 | HR 66 | Temp 98.2°F | Wt 172.4 lb

## 2023-08-25 DIAGNOSIS — D6489 Other specified anemias: Secondary | ICD-10-CM

## 2023-08-25 DIAGNOSIS — D508 Other iron deficiency anemias: Secondary | ICD-10-CM | POA: Diagnosis not present

## 2023-08-25 DIAGNOSIS — D649 Anemia, unspecified: Secondary | ICD-10-CM | POA: Diagnosis not present

## 2023-08-25 NOTE — Progress Notes (Signed)
   Subjective:    Patient ID: Mike Wong, male    DOB: 06-Apr-1946, 77 y.o.   MRN: [id number]  HPI He is here for consult concerning recent blood work which did show his hemoglobin has dropped.  He has no symptoms of any bleeding, abdominal pain, change in stool or weight.  He does have a history of colonic polyp and is scheduled for follow-up colonoscopy in 2025.  His diet is adequate.  He has no particular complaints.   Review of Systems     Objective:    Physical Exam Alert and in no distress otherwise not examined       Assessment & Plan:   Problem List Items Addressed This Visit   None Visit Diagnoses     Anemia due to other cause, not classified    -  Primary   Relevant Orders   CBC with Differential/Platelet   Anemia panel

## 2023-08-26 MED ORDER — IRON (FERROUS SULFATE) 325 (65 FE) MG PO TABS
325.0000 mg | ORAL_TABLET | Freq: Every day | ORAL | 1 refills | Status: DC
Start: 2023-08-26 — End: 2024-08-17

## 2023-08-26 NOTE — Addendum Note (Signed)
Addended by: Ronnald Nian on: 08/26/2023 08:11 AM   Modules accepted: Orders

## 2023-08-27 LAB — ANEMIA PANEL
Ferritin: 15 ng/mL — ABNORMAL LOW (ref 30–400)
Folate, Hemolysate: 467 ng/mL
Folate, RBC: 1459 ng/mL (ref >498)
Hematocrit: 32 % — ABNORMAL LOW (ref 37.5–51.0)
Iron Saturation: 8 % — CL (ref 15–55)
Iron: 31 ug/dL — ABNORMAL LOW (ref 38–169)
Retic Ct Pct: 1.9 % (ref 0.6–2.6)
Total Iron Binding Capacity: 394 ug/dL (ref 250–450)
UIBC: 363 ug/dL — ABNORMAL HIGH (ref 111–343)
Vitamin B-12: 1245 pg/mL (ref 232–1245)

## 2023-08-27 LAB — CBC WITH DIFFERENTIAL/PLATELET
Basophils Absolute: 0 10*3/uL (ref 0.0–0.2)
Basos: 0 %
EOS (ABSOLUTE): 0.1 10*3/uL (ref 0.0–0.4)
Eos: 1 %
Hemoglobin: 9.4 g/dL — ABNORMAL LOW (ref 13.0–17.7)
Immature Grans (Abs): 0 10*3/uL (ref 0.0–0.1)
Immature Granulocytes: 0 %
Lymphocytes Absolute: 1 10*3/uL (ref 0.7–3.1)
Lymphs: 18 %
MCH: 23.3 pg — ABNORMAL LOW (ref 26.6–33.0)
MCHC: 29.4 g/dL — ABNORMAL LOW (ref 31.5–35.7)
MCV: 79 fL (ref 79–97)
Monocytes Absolute: 0.4 10*3/uL (ref 0.1–0.9)
Monocytes: 8 %
Neutrophils Absolute: 4.1 10*3/uL (ref 1.4–7.0)
Neutrophils: 73 %
Platelets: 344 10*3/uL (ref 150–450)
RBC: 4.04 x10E6/uL — ABNORMAL LOW (ref 4.14–5.80)
RDW: 18.8 % — ABNORMAL HIGH (ref 11.6–15.4)
WBC: 5.7 10*3/uL (ref 3.4–10.8)

## 2023-10-05 ENCOUNTER — Other Ambulatory Visit: Payer: Medicare Other

## 2023-10-05 DIAGNOSIS — D508 Other iron deficiency anemias: Secondary | ICD-10-CM

## 2023-10-06 LAB — CBC WITH DIFFERENTIAL/PLATELET
Basophils Absolute: 0 10*3/uL (ref 0.0–0.2)
Basos: 1 %
EOS (ABSOLUTE): 0.1 10*3/uL (ref 0.0–0.4)
Eos: 2 %
Hematocrit: 41.3 % (ref 37.5–51.0)
Hemoglobin: 13.2 g/dL (ref 13.0–17.7)
Immature Grans (Abs): 0 10*3/uL (ref 0.0–0.1)
Immature Granulocytes: 0 %
Lymphocytes Absolute: 1.2 10*3/uL (ref 0.7–3.1)
Lymphs: 21 %
MCH: 27.5 pg (ref 26.6–33.0)
MCHC: 32 g/dL (ref 31.5–35.7)
MCV: 86 fL (ref 79–97)
Monocytes Absolute: 0.4 10*3/uL (ref 0.1–0.9)
Monocytes: 7 %
Neutrophils Absolute: 3.9 10*3/uL (ref 1.4–7.0)
Neutrophils: 69 %
Platelets: 281 10*3/uL (ref 150–450)
RBC: 4.8 x10E6/uL (ref 4.14–5.80)
WBC: 5.6 10*3/uL (ref 3.4–10.8)

## 2023-10-07 ENCOUNTER — Other Ambulatory Visit: Payer: Self-pay | Admitting: Internal Medicine

## 2023-10-07 DIAGNOSIS — D6489 Other specified anemias: Secondary | ICD-10-CM

## 2023-10-14 ENCOUNTER — Other Ambulatory Visit: Payer: Self-pay | Admitting: Family Medicine

## 2023-10-14 DIAGNOSIS — E785 Hyperlipidemia, unspecified: Secondary | ICD-10-CM

## 2023-11-08 ENCOUNTER — Other Ambulatory Visit: Payer: Medicare Other

## 2023-11-08 DIAGNOSIS — D6489 Other specified anemias: Secondary | ICD-10-CM | POA: Diagnosis not present

## 2023-11-08 LAB — CBC WITH DIFFERENTIAL/PLATELET
Basophils Absolute: 0 10*3/uL (ref 0.0–0.2)
Basos: 0 %
EOS (ABSOLUTE): 0.1 10*3/uL (ref 0.0–0.4)
Eos: 2 %
Hematocrit: 41.3 % (ref 37.5–51.0)
Hemoglobin: 13.6 g/dL (ref 13.0–17.7)
Immature Grans (Abs): 0 10*3/uL (ref 0.0–0.1)
Immature Granulocytes: 0 %
Lymphocytes Absolute: 1.1 10*3/uL (ref 0.7–3.1)
Lymphs: 20 %
MCH: 29.3 pg (ref 26.6–33.0)
MCHC: 32.9 g/dL (ref 31.5–35.7)
MCV: 89 fL (ref 79–97)
Monocytes Absolute: 0.4 10*3/uL (ref 0.1–0.9)
Monocytes: 8 %
Neutrophils Absolute: 3.9 10*3/uL (ref 1.4–7.0)
Neutrophils: 70 %
Platelets: 261 10*3/uL (ref 150–450)
RBC: 4.64 x10E6/uL (ref 4.14–5.80)
RDW: 18.2 % — ABNORMAL HIGH (ref 11.6–15.4)
WBC: 5.6 10*3/uL (ref 3.4–10.8)

## 2023-12-02 ENCOUNTER — Other Ambulatory Visit: Payer: Self-pay

## 2023-12-02 DIAGNOSIS — I739 Peripheral vascular disease, unspecified: Secondary | ICD-10-CM

## 2023-12-07 ENCOUNTER — Ambulatory Visit (INDEPENDENT_AMBULATORY_CARE_PROVIDER_SITE_OTHER)
Admission: RE | Admit: 2023-12-07 | Discharge: 2023-12-07 | Disposition: A | Discharge status: Home / Self Care | Payer: Medicare Other | Source: Ambulatory Visit | Attending: Vascular Surgery

## 2023-12-07 ENCOUNTER — Ambulatory Visit (HOSPITAL_COMMUNITY)
Admission: RE | Admit: 2023-12-07 | Discharge: 2023-12-07 | Disposition: A | Discharge status: Home / Self Care | Payer: Medicare Other | Source: Ambulatory Visit | Attending: Vascular Surgery | Admitting: Vascular Surgery

## 2023-12-07 ENCOUNTER — Ambulatory Visit (INDEPENDENT_AMBULATORY_CARE_PROVIDER_SITE_OTHER): Payer: Medicare Other | Admitting: Physician Assistant

## 2023-12-07 VITALS — BP 146/82 | HR 59 | Temp 98.5°F | Resp 18 | Ht 69.5 in | Wt 170.6 lb

## 2023-12-07 DIAGNOSIS — Z95828 Presence of other vascular implants and grafts: Secondary | ICD-10-CM

## 2023-12-07 DIAGNOSIS — I739 Peripheral vascular disease, unspecified: Secondary | ICD-10-CM | POA: Insufficient documentation

## 2023-12-07 LAB — VAS US ABI WITH/WO TBI
Left ABI: 1.28
Right ABI: 1.03

## 2023-12-07 NOTE — Progress Notes (Signed)
HISTORY AND PHYSICAL     CC:  follow up. Requesting Provider:  Ronnald Nian, MD  HPI: This is a 78 y.o. male who is here today for follow up for PAD.  Pt has hx of right above knee popliteal to below knee popliteal artery bypass with ipsilateral reversed GSV and tibial embolectomy on 03/03/2016 by Dr. Darrick Penna for right popliteal artery aneurysm.   Pt was last seen 12/01/2022 and at that time, he was doing well and was asymptomatic.   The pt returns today for follow up.  He states he is doing well.  He denies any claudication, rest pain or non healing wounds.  He states he still has a little numbness on his shin but this is also better.  He is not taking asa as his PCP told him he could stop this.  He is compliant with his statin.   He does have some spider veins on his feet.  These have never bled.    The pt is on a statin for cholesterol management.    The pt is not on an aspirin.    Other AC:  none The pt is on CCB for hypertension.  The pt is not on medication for diabetes. Tobacco hx:  former-quit in 1982.  Pt does not have family hx of AAA.  Pt is from [place].   Past Medical History:  Diagnosis Date   Aneurysm artery, popliteal (HCC) 02/2016   RT LEG    Arthritis    Clotting disorder (HCC)    leg artery aneurysm    Finger fracture, right    5th finger   High cholesterol    Hypertension    Kidney stones     Past Surgical History:  Procedure Laterality Date   BYPASS GRAFT POPLITEAL TO POPLITEAL Right 03/03/2016   Procedure: BYPASS RIGHT ABOVE POPLITEAL TO BELOW POPLITEAL USING REVERSED RIGHT GREATER SAPHENOUS VEIN;  Surgeon: Sherren Kerns, MD;  Location: [facility] OR;  Service: Vascular;  Laterality: Right;   COLONOSCOPY  11/04/06   ELECTROCARDIOGRAM  03/16/03   EMBOLECTOMY Right 03/03/2016   Procedure: RIGHT ANTERIOR TIBIAL EMBOLECTOMY;  Surgeon: Sherren Kerns, MD;  Location: [facility] OR;  Service: Vascular;  Laterality: Right;   FALSE ANEURYSM REPAIR Right 03/03/2016    Procedure: LIGATION RIGHT  POPLITEAL ARTERY ANEURYSM;  Surgeon: Sherren Kerns, MD;  Location: [facility] OR;  Service: Vascular;  Laterality: Right;   LITHOTRIPSY     LITHOTRIPSY     OPEN REDUCTION INTERNAL FIXATION (ORIF) METACARPAL Right 10/08/2015   Procedure: OPEN REDUCTION INTERNAL FIXATION (ORIF) METACARPAL RIGHT RING FINGER;  Surgeon: Cindee Salt, MD;  Location: [facility];  Service: Orthopedics;  Laterality: Right;   POLYPECTOMY      No Known Allergies  Current Outpatient Medications  Medication Sig Dispense Refill   amLODipine (NORVASC) 10 MG tablet TAKE 1 TABLET(10 MG) BY MOUTH DAILY 90 tablet 3   aspirin 81 MG chewable tablet Chew 81 mg by mouth daily. (Patient not taking: Reported on 08/17/2023)     atorvastatin (LIPITOR) 10 MG tablet TAKE 1 TABLET(10 MG) BY MOUTH DAILY 90 tablet 3   Cholecalciferol (VITAMIN D HIGH POTENCY) 25 MCG (1000 UT) capsule Take 1,000 Units by mouth daily.     Coenzyme Q10 (CO Q10) 100 MG CAPS Take 300 mg by mouth.     Iron, Ferrous Sulfate, 325 (65 Fe) MG TABS Take 325 mg by mouth daily. 90 tablet 1   KRILL OIL PO Take by  mouth.     tamsulosin (FLOMAX) 0.4 MG CAPS capsule Take 1 capsule (0.4 mg total) by mouth daily. 90 capsule 1   No current facility-administered medications for this visit.    Family History  Problem Relation Age of Onset   Colon cancer Neg Hx    Colon polyps Neg Hx    Esophageal cancer Neg Hx    Rectal cancer Neg Hx    Stomach cancer Neg Hx     Social History   Socioeconomic History   Marital status: Married    Spouse name: Not on file   Number of children: Not on file   Years of education: Not on file   Highest education level: Not on file  Occupational History   Not on file  Tobacco Use   Smoking status: Former   Smokeless tobacco: Never   Tobacco comments:    QUIT SMOKING IN 1982  Vaping Use   Vaping status: Never Used  Substance and Sexual Activity   Alcohol use: Yes    Alcohol/week: 1.0 standard  drink of alcohol    Types: 1 Cans of beer per week    Comment: or less   Drug use: No   Sexual activity: Yes  Other Topics Concern   Not on file  Social History Narrative   Not on file   Social Drivers of Health   Financial Resource Strain: Low Risk  (07/31/2022)   Overall Financial Resource Strain (CARDIA)    Difficulty of Paying Living Expenses: Not hard at all  Food Insecurity: No Food Insecurity (07/31/2022)   Hunger Vital Sign    Worried About Running Out of Food in the Last Year: Never true    Ran Out of Food in the Last Year: Never true  Transportation Needs: No Transportation Needs (07/31/2022)   PRAPARE - Administrator, Civil Service (Medical): No    Lack of Transportation (Non-Medical): No  Physical Activity: Inactive (07/31/2022)   Exercise Vital Sign    Days of Exercise per Week: 0 days    Minutes of Exercise per Session: 0 min  Stress: No Stress Concern Present (07/31/2022)   [employer] of Occupational Health - Occupational Stress Questionnaire    Feeling of Stress : Not at all  Social Connections: Not on file  Intimate Partner Violence: Not on file     REVIEW OF SYSTEMS:   [X]  denotes positive finding, [ ]  denotes negative finding Cardiac  Comments:  Chest pain or chest pressure:    Shortness of breath upon exertion:    Short of breath when lying flat:    Irregular heart rhythm:        Vascular    Pain in calf, thigh, or hip brought on by ambulation:    Pain in feet at night that wakes you up from your sleep:     Blood clot in your veins:    Leg swelling:         Pulmonary    Oxygen at home:    Productive cough:     Wheezing:         Neurologic    Sudden weakness in arms or legs:     Sudden numbness in arms or legs:     Sudden onset of difficulty speaking or slurred speech:    Temporary loss of vision in one eye:     Problems with dizziness:         Gastrointestinal    Blood in stool:  Vomited blood:          Genitourinary    Burning when urinating:     Blood in urine:        Psychiatric    Major depression:         Hematologic    Bleeding problems:    Problems with blood clotting too easily:        Skin    Rashes or ulcers:        Constitutional    Fever or chills:      PHYSICAL EXAMINATION:  Today's Vitals   12/07/23 1307  BP: (!) 146/82  Pulse: (!) 59  Resp: 18  Temp: 98.5 F (36.9 C)  TempSrc: Temporal  SpO2: 94%  Weight: 170 lb 9.6 oz (77.4 kg)  Height: 5' 9.5" (1.765 m)  PainSc: 0-No pain   Body mass index is 24.83 kg/m.   General:  WDWN in NAD; vital signs documented above Gait: Not observed HENT: WNL, normocephalic Pulmonary: normal non-labored breathing , without wheezing Cardiac: regular HR, without carotid bruits Abdomen: soft, NT; aortic pulse is not palpable Skin: without rashes Vascular Exam/Pulses:  Right Left  Radial 2+ (normal) 2+ (normal)  Popliteal Unable to palpate Unable to palpate  DP 2+ (normal) 2+ (normal)  PT 1+ (weak) 2+ (normal)   Extremities: without ischemic changes, without Gangrene , without cellulitis; without open wounds; mild RLE swelling with superficial spider veins present bilateral feet right >left Musculoskeletal: no muscle wasting or atrophy  Neurologic: A&O X 3 Psychiatric:  The pt has Normal affect.   Non-Invasive Vascular Imaging:   ABI's/TBI's on 12/07/2023: Right:  1.03/0.39 - Great toe pressure: 47 Left:  1.28/0.77 - Great toe pressure: 94  Arterial duplex on 12/07/2023: Left Graft #1: Distal SFA to below knee popliteal artery bypass graft  +--------------------+--------+--------+---------+--------+                     PSV cm/sStenosisWaveform Comments  +--------------------+--------+--------+---------+--------+  Inflow             98              triphasic          +--------------------+--------+--------+---------+--------+  Proximal Anastomosis82              triphasic           +--------------------+--------+--------+---------+--------+  Proximal Graft      106             triphasic          +--------------------+--------+--------+---------+--------+  Mid Graft           107             triphasic          +--------------------+--------+--------+---------+--------+  Distal Graft        103             triphasic          +--------------------+--------+--------+---------+--------+  Distal Anastomosis  84              triphasic          +--------------------+--------+--------+---------+--------+  Outflow            55              triphasic          +--------------------+--------+--------+---------+--------+   Summary:  Left: Patent distal SFA to below knee popliteal artery bypass graft without evidence of stenosis   Previous ABI's/TBI's on 11/30/2022:  Right:  0.92/0.48 - Great toe pressure: 70 Left:  1.12/0.67 - Great toe pressure:  98  Previous arterial duplex on 11/30/2022: Right Graft #1: Distal SFA to below knee popliteal artery bypass graft  +------------------+--------+--------+---------+--------+                   PSV cm/sStenosisWaveform Comments  +------------------+--------+--------+---------+--------+  Inflow           68              triphasic          +------------------+--------+--------+---------+--------+  Prox Anastomosis  69              triphasic          +------------------+--------+--------+---------+--------+  Proximal Graft    108             triphasic          +------------------+--------+--------+---------+--------+  Mid Graft         83              triphasic          +------------------+--------+--------+---------+--------+  Distal Graft      67              triphasic          +------------------+--------+--------+---------+--------+  Distal Anastomosis95              triphasic          +------------------+--------+--------+---------+--------+  Outflow           40              triphasic          +------------------+--------+--------+---------+--------+     ASSESSMENT/PLAN:: 78 y.o. male here for follow up for PAD with hx of  right above knee popliteal to below knee popliteal artery bypass with ipsilateral reversed GSV and tibial embolectomy on 03/03/2016 by Dr. Darrick Penna for right popliteal artery aneurysm.    -pt doing well-duplex reveals patent bypass without stenosis and he has palpable DP pulses bilaterally -he had stopped his asa.  Given he has a bypass in the right leg, I asked him to continue back with aspirin 81mg  daily.   -continue statin -discussed how to hold pressure should any of the spider veins ever bleed.   -pt will f/u in one year with RLE arterial duplex and ABI.   Doreatha Massed, [facility] [phone]  Clinic MD:   Chestine Spore

## 2023-12-28 ENCOUNTER — Other Ambulatory Visit: Payer: Self-pay

## 2023-12-28 DIAGNOSIS — I739 Peripheral vascular disease, unspecified: Secondary | ICD-10-CM

## 2024-01-11 ENCOUNTER — Encounter: Payer: Self-pay | Admitting: Internal Medicine

## 2024-06-29 DIAGNOSIS — H2513 Age-related nuclear cataract, bilateral: Secondary | ICD-10-CM | POA: Diagnosis not present

## 2024-06-29 DIAGNOSIS — H1711 Central corneal opacity, right eye: Secondary | ICD-10-CM | POA: Diagnosis not present

## 2024-06-29 DIAGNOSIS — H43812 Vitreous degeneration, left eye: Secondary | ICD-10-CM | POA: Diagnosis not present

## 2024-06-29 DIAGNOSIS — D3132 Benign neoplasm of left choroid: Secondary | ICD-10-CM | POA: Diagnosis not present

## 2024-07-21 ENCOUNTER — Other Ambulatory Visit: Payer: Self-pay | Admitting: Family Medicine

## 2024-07-21 DIAGNOSIS — E785 Hyperlipidemia, unspecified: Secondary | ICD-10-CM

## 2024-08-17 ENCOUNTER — Ambulatory Visit: Payer: Medicare Other | Admitting: Family Medicine

## 2024-08-17 ENCOUNTER — Encounter: Payer: Self-pay | Admitting: Family Medicine

## 2024-08-17 VITALS — BP 126/80 | HR 76 | Ht 69.0 in | Wt 179.6 lb

## 2024-08-17 DIAGNOSIS — N401 Enlarged prostate with lower urinary tract symptoms: Secondary | ICD-10-CM

## 2024-08-17 DIAGNOSIS — E785 Hyperlipidemia, unspecified: Secondary | ICD-10-CM

## 2024-08-17 DIAGNOSIS — I10 Essential (primary) hypertension: Secondary | ICD-10-CM | POA: Diagnosis not present

## 2024-08-17 DIAGNOSIS — Z0000 Encounter for general adult medical examination without abnormal findings: Secondary | ICD-10-CM

## 2024-08-17 DIAGNOSIS — D508 Other iron deficiency anemias: Secondary | ICD-10-CM | POA: Diagnosis not present

## 2024-08-17 DIAGNOSIS — R350 Frequency of micturition: Secondary | ICD-10-CM

## 2024-08-17 LAB — LIPID PANEL

## 2024-08-17 MED ORDER — ATORVASTATIN CALCIUM 10 MG PO TABS: ORAL_TABLET | ORAL | 0 refills | Status: DC

## 2024-08-17 MED ORDER — ASPIRIN 81 MG PO CHEW: 81.0000 mg | CHEWABLE_TABLET | Freq: Every day | ORAL | 1 refills | Status: AC

## 2024-08-17 MED ORDER — IRON (FERROUS SULFATE) 325 (65 FE) MG PO TABS: 325.0000 mg | ORAL_TABLET | Freq: Every day | ORAL | 1 refills | Status: AC

## 2024-08-17 MED ORDER — AMLODIPINE BESYLATE 10 MG PO TABS: ORAL_TABLET | ORAL | 3 refills | Status: AC

## 2024-08-17 NOTE — Progress Notes (Signed)
 Name: Mike Wong   Date of Visit: 08/17/24   Date of last visit with me: Visit date not found   CHIEF COMPLAINT:  Chief Complaint  Patient presents with   Medicare Wellness    Awv. Wants to check and see if he needs blood work.        HPI:  Discussed the use of AI scribe software for clinical note transcription with the patient, who gave verbal consent to proceed.  History of Present Illness   Mike Wong is a 78 year old who presents for a routine follow-up and blood work.  He was informed last year about having low hemoglobin levels and has been taking iron  supplements every other day due to side effects such as black stools. He is unsure if he still needs to continue the supplements.  He experiences nocturia, waking up three times a night to urinate but is able to fall back asleep immediately. He previously tried Flomax  for prostate issues but discontinued it after experiencing adverse effects. He currently uses a non-prescription supplement from [employer] for prostate health.  He notes a weight gain of about five pounds over the past year, which he attributes to his eating habits. 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         OBJECTIVE:       08/17/2024    1:35 PM  Depression screen PHQ 2/9  Decreased Interest 0  Down, Depressed, Hopeless 0  PHQ - 2 Score 0     BP Readings from Last 3 Encounters:  08/17/24 126/80  12/07/23 (!) 146/82  08/25/23 126/76    BP 126/80   Pulse 76   Ht 5' 9 (1.753 m)   Wt 179 lb 9.6 oz (81.5 kg)   SpO2 97%   BMI 26.52 kg/m    Physical Exam          Physical Exam Constitutional:      Appearance: Normal appearance.  Neurological:     General: No focal deficit present.     Mental Status: He is alert and oriented to person, place, and time. Mental status is at baseline.     ASSESSMENT/PLAN:   Assessment & Plan Medicare annual wellness  visit, subsequent  Essential hypertension  Hyperlipidemia with target LDL less than 130  Iron  deficiency anemia secondary to inadequate dietary iron  intake  Benign prostatic hyperplasia with urinary frequency    Assessment and Plan    Medicare Annual Wellness -  I reviewed and addressed the Medicare-specific components of today's visit, including preventive services eligibility, screening recommendations, and health maintenance items as applicable.   - Patient declined all vaccines including shingles, covid, flu and prevnar.   Nocturia Experiences nocturia, waking up three times a night to urinate. Previously tried Flomax  but discontinued due to adverse effects. Currently using an over-the-counter supplement. - Monitor urinary symptoms and report any changes, especially difficulty urinating. - Consider treatment if nocturia becomes bothersome or if urination issues arise.  Benign prostatic hyperplasia without lower urinary tract symptoms Benign prostatic hyperplasia is present but not causing significant lower urinary tract symptoms.  Essential hypertension Blood pressure is well-controlled.  Anemia Previously had anemia which was treated with iron  supplementation. Last blood work showed resolution of anemia. - Perform blood work to assess current anemia status. - Continue iron  supplementation every other day until further notice.         Christiann Hagerty A. Vita MD [facility]

## 2024-08-18 ENCOUNTER — Ambulatory Visit: Payer: Self-pay | Admitting: Family Medicine

## 2024-08-18 LAB — BASIC METABOLIC PANEL WITH GFR
BUN/Creatinine Ratio: 21 (ref 10–24)
BUN: 21 mg/dL (ref 8–27)
CO2: 21 mmol/L (ref 20–29)
Calcium: 9.4 mg/dL (ref 8.6–10.2)
Chloride: 103 mmol/L (ref 96–106)
Creatinine, Ser: 1 mg/dL (ref 0.76–1.27)
Glucose: 119 mg/dL — AB (ref 70–99)
Potassium: 4.2 mmol/L (ref 3.5–5.2)
Sodium: 140 mmol/L (ref 134–144)
eGFR: 78 mL/min/1.73 (ref >59)

## 2024-08-18 LAB — LIPID PANEL
Cholesterol, Total: 173 mg/dL (ref 100–199)
HDL: 55 mg/dL (ref >39)
LDL CALC COMMENT:: 3.1 ratio (ref 0.0–5.0)
LDL Chol Calc (NIH): 76 mg/dL (ref 0–99)
Triglycerides: 262 mg/dL — AB (ref 0–149)
VLDL Cholesterol Cal: 42 mg/dL — AB (ref 5–40)

## 2024-08-18 LAB — CBC WITH DIFFERENTIAL/PLATELET
Basophils Absolute: 0 x10E3/uL (ref 0.0–0.2)
Basos: 1 %
EOS (ABSOLUTE): 0.1 x10E3/uL (ref 0.0–0.4)
Eos: 1 %
Hematocrit: 39 % (ref 37.5–51.0)
Hemoglobin: 12.8 g/dL — ABNORMAL LOW (ref 13.0–17.7)
Immature Grans (Abs): 0 x10E3/uL (ref 0.0–0.1)
Immature Granulocytes: 0 %
Lymphocytes Absolute: 1.1 x10E3/uL (ref 0.7–3.1)
Lymphs: 17 %
MCH: 31.4 pg (ref 26.6–33.0)
MCHC: 32.8 g/dL (ref 31.5–35.7)
MCV: 96 fL (ref 79–97)
Monocytes Absolute: 0.4 x10E3/uL (ref 0.1–0.9)
Monocytes: 6 %
Neutrophils Absolute: 4.8 x10E3/uL (ref 1.4–7.0)
Neutrophils: 75 %
Platelets: 309 x10E3/uL (ref 150–450)
RBC: 4.07 x10E6/uL — ABNORMAL LOW (ref 4.14–5.80)
RDW: 13.2 % (ref 11.6–15.4)
WBC: 6.4 x10E3/uL (ref 3.4–10.8)

## 2024-08-18 LAB — PSA: Prostate Specific Ag, Serum: 2.7 ng/mL (ref 0.0–4.0)

## 2024-10-16 ENCOUNTER — Other Ambulatory Visit: Payer: Self-pay | Admitting: Family Medicine

## 2024-10-16 ENCOUNTER — Telehealth: Payer: Self-pay | Admitting: Family Medicine

## 2024-10-16 DIAGNOSIS — I10 Essential (primary) hypertension: Secondary | ICD-10-CM

## 2024-10-16 DIAGNOSIS — E785 Hyperlipidemia, unspecified: Secondary | ICD-10-CM

## 2024-10-16 NOTE — Telephone Encounter (Unsigned)
 Copied from CRM [id number]. Topic: Clinical - Medication Refill >> Oct 16, 2024  2:58 PM Amy B wrote: Medication: amLODipine  (NORVASC ) 10 MG tablet atorvastatin  (LIPITOR) 10 MG tablet  Has the patient contacted their pharmacy? Yes (Agent: If no, request that the patient contact the pharmacy for the refill. If patient does not wish to contact the pharmacy document the reason why and proceed with request.) (Agent: If yes, when and what did the pharmacy advise?)  This is the patient's preferred pharmacy:  [id number] [place], [place] - [address] AT [facility] [address] & [facility] [address] [place] [address] Phone: [phone] Fax: [phone]  Is this the correct pharmacy for this prescription? Yes If no, delete pharmacy and type the correct one.   Has the prescription been filled recently? No  Is the patient out of the medication? No  Has the patient been seen for an appointment in the last year OR does the patient have an upcoming appointment? Yes  Can we respond through MyChart? Yes  Agent: Please be advised that Rx refills may take up to 3 business days. We ask that you follow-up with your pharmacy.

## 2024-10-18 MED ORDER — ATORVASTATIN CALCIUM 10 MG PO TABS: ORAL_TABLET | ORAL | 0 refills | Status: AC

## 2025-02-15 ENCOUNTER — Ambulatory Visit: Payer: Self-pay | Admitting: Family Medicine

## 2025-08-20 ENCOUNTER — Encounter: Payer: Self-pay | Admitting: Family Medicine
